# Patient Record
Sex: Female | Born: 1945
Health system: Southern US, Community
[De-identification: ages and names within clinical notes are randomized; demographics above are authoritative.]

## PROBLEM LIST (undated history)

## (undated) DIAGNOSIS — I10 Essential (primary) hypertension: Secondary | ICD-10-CM

## (undated) DIAGNOSIS — R413 Other amnesia: Secondary | ICD-10-CM

## (undated) DIAGNOSIS — M1990 Unspecified osteoarthritis, unspecified site: Secondary | ICD-10-CM

## (undated) DIAGNOSIS — E785 Hyperlipidemia, unspecified: Secondary | ICD-10-CM

## (undated) DIAGNOSIS — E119 Type 2 diabetes mellitus without complications: Secondary | ICD-10-CM

## (undated) HISTORY — DX: Hyperlipidemia, unspecified: E78.5

## (undated) HISTORY — DX: Essential (primary) hypertension: I10

## (undated) HISTORY — PX: EYE SURGERY: SHX253

## (undated) HISTORY — DX: Type 2 diabetes mellitus without complications: E11.9

---

## 1990-12-13 HISTORY — PX: ABDOMINAL HYSTERECTOMY: SUR658

## 1996-12-13 HISTORY — PX: OTHER SURGICAL HISTORY: SHX169

## 2012-04-17 LAB — HEMOGLOBIN A1C: Hgb A1c MFr Bld: 6.7 % — AB (ref 4.0–6.0)

## 2012-12-04 LAB — LIPID PANEL
Cholesterol: 211 mg/dL — AB (ref 0–200)
HDL: 52 mg/dL (ref 35–70)
HDL: 52 mg/dL (ref 35–70)
LDL Cholesterol: 131 mg/dL
LDL Cholesterol: 131 mg/dL
Triglycerides: 139 mg/dL (ref 40–160)

## 2012-12-04 LAB — BASIC METABOLIC PANEL
Creatinine: 0.8 mg/dL (ref 0.5–1.1)
Creatinine: 0.8 mg/dL (ref 0.5–1.1)
Potassium: 4.1 mmol/L (ref 3.4–5.3)

## 2013-01-03 ENCOUNTER — Encounter: Payer: Self-pay | Admitting: Family Medicine

## 2013-01-03 ENCOUNTER — Ambulatory Visit (INDEPENDENT_AMBULATORY_CARE_PROVIDER_SITE_OTHER): Payer: Medicare HMO | Admitting: Family Medicine

## 2013-01-03 VITALS — BP 116/69 | HR 72 | Ht 68.5 in | Wt 191.0 lb

## 2013-01-03 DIAGNOSIS — E785 Hyperlipidemia, unspecified: Secondary | ICD-10-CM

## 2013-01-03 DIAGNOSIS — I10 Essential (primary) hypertension: Secondary | ICD-10-CM

## 2013-01-03 DIAGNOSIS — E1122 Type 2 diabetes mellitus with diabetic chronic kidney disease: Secondary | ICD-10-CM | POA: Insufficient documentation

## 2013-01-03 DIAGNOSIS — M25561 Pain in right knee: Secondary | ICD-10-CM | POA: Insufficient documentation

## 2013-01-03 DIAGNOSIS — E11319 Type 2 diabetes mellitus with unspecified diabetic retinopathy without macular edema: Secondary | ICD-10-CM | POA: Insufficient documentation

## 2013-01-03 DIAGNOSIS — E119 Type 2 diabetes mellitus without complications: Secondary | ICD-10-CM

## 2013-01-03 NOTE — Progress Notes (Signed)
CC: Lisa Phelps is a 67 y.o. female is here for Establish Care   Subjective: HPI:  Very pleasant 67 year old here to establish care, former physician out of insurance network.  History of type 2  Diabetes: Has been taking metformin 500 mg twice a day. States over the last year or A1c has historically been around 6.0, never above 7.0. She denies polyuria polyphasia polydipsia. Denies diarrhea or GI disturbance. She denies motor or sensory disturbances poorly healing wounds. Denies foot lesions. She stated that join a gym but stays active with her grandchildren. Tries to watch what she eats with respect to carbohydrates. She says me she had a A1c done December, specific values unknown  History of essential hypertension: Currently taking losartan and hydrochlorothiazide. No outside blood pressures to report other than after visit summary from a December visit at her former physicians office showing normotensive blood pressure. Denies motor or sensory disturbances, chest pain, shortness of breath, headaches, orthopnea, nor peripheral edema she takes a baby aspirin a day  History of hyperlipidemia: She was once tried on Pravachol but had an intolerance, she can member specifically what happened. She's not on a statin and we are waiting outside records. She shows evidence of she had a lipid panel done in December she's uncertain of the specifics. She denies limb claudication or rest pain extremities.  Review of Systems - General ROS: negative for - chills, fever, night sweats, weight gain or weight loss Ophthalmic ROS: negative for - decreased vision Psychological ROS: negative for - anxiety or depression ENT ROS: negative for - hearing change, nasal congestion, tinnitus or allergies Hematological and Lymphatic ROS: negative for - bleeding problems, bruising or swollen lymph nodes Breast ROS: negative Respiratory ROS: no cough, shortness of breath, or wheezing Cardiovascular ROS: no chest pain or  dyspnea on exertion Gastrointestinal ROS: no abdominal pain, change in bowel habits, or black or bloody stools Genito-Urinary ROS: negative for - genital discharge, genital ulcers, incontinence or abnormal bleeding from genitals Musculoskeletal ROS: negative for - joint pain or muscle pain Neurological ROS: negative for - headaches or memory loss Dermatological ROS: negative for lumps, mole changes, rash and skin lesion changes  Past Medical History  Diagnosis Date  . Hypertension   . Diabetes   . Hyperlipidemia      Family History  Problem Relation Age of Onset  . Diabetes    . Hyperlipidemia    . Hypertension       History  Substance Use Topics  . Smoking status: Former Games developer  . Smokeless tobacco: Not on file  . Alcohol Use: Yes     Objective: Filed Vitals:   01/03/13 0903  BP: 116/69  Pulse: 72    General: Alert and Oriented, No Acute Distress HEENT: Pupils equal, round, reactive to light. Conjunctivae clear.  External ears unremarkable, canals clear with intact TMs with appropriate landmarks.  Middle ear appears open without effusion. Pink inferior turbinates.  Moist mucous membranes, pharynx without inflammation nor lesions.  Neck supple without palpable lymphadenopathy nor abnormal masses. Lungs: Clear to auscultation bilaterally, no wheezing/ronchi/rales.  Comfortable work of breathing. Good air movement. Cardiac: Regular rate and rhythm. Normal S1/S2.  No murmurs, rubs, nor gallops.   Abdomen:  obese soft nontender  Extremities: No peripheral edema.  Strong peripheral pulses.  Mental Status: No depression, anxiety, nor agitation. Skin: Warm and dry.  Assessment & Plan: Lisa Phelps was seen today for establish care.  Diagnoses and associated orders for this visit:  Essential hypertension  Type 2 diabetes mellitus  Hyperlipidemia ldl goal <100  Other Orders - aspirin 81 MG tablet; Take 81 mg by mouth daily. - hydrochlorothiazide (HYDRODIURIL) 12.5 MG tablet;  Take 12.5 mg by mouth daily. - losartan (COZAAR) 100 MG tablet; Take 100 mg by mouth daily. - metFORMIN (GLUCOPHAGE) 500 MG tablet; Take 500 mg by mouth 2 (two) times daily with a meal.    Essential hypertension: Controlled, agree with current medication regimen, discussed we can consider trial of hydrochlorothiazide cessation in the distant future if blood pressures remain well within normal range. Type 2 diabetes: Clinically stable, agree with her current metformin regimen. Sounds like she's met her goals less than 7.0 A1c wise, awaiting outside records, expect A1c in 2-3 months Hyperlipidemia: Clinically stable but I would like her to start a statin if possible. Will review outside records to determine what her intolerance was and if other statins were tried.  Return in about 3 months (around 04/03/2013).

## 2013-01-17 ENCOUNTER — Encounter: Payer: Self-pay | Admitting: Family Medicine

## 2013-01-17 ENCOUNTER — Telehealth: Payer: Self-pay | Admitting: Family Medicine

## 2013-01-17 NOTE — Telephone Encounter (Signed)
Pt notified,called and asked exactly where she had Bone Density done so I could call and get a copy. She states she had it done in [place] and she had a copy and would bring it by when she picked up records

## 2013-01-17 NOTE — Telephone Encounter (Signed)
Sue Lush, Will you please let Mrs. Paar know that I got her records from Dr. Manson Passey and per her request these records are at the front desk here for her.   (Also, she had a DEXA at [facility] in may of 2013 that was not included.  Can you please see if [facility] would be willing to forward [place] the results from this.  Thank you.)

## 2013-01-19 ENCOUNTER — Encounter: Payer: Self-pay | Admitting: *Deleted

## 2013-01-19 NOTE — Telephone Encounter (Signed)
Pt brought papers in but it was not what I needed, pt states she had the Dexa with [facility]. I will see if I can find out exactly where

## 2013-02-06 ENCOUNTER — Telehealth: Payer: Self-pay | Admitting: *Deleted

## 2013-02-06 NOTE — Telephone Encounter (Signed)
FYI: I'm not sure where this pt had her dexa. She said she had it done with [facility], but they do have record of her having one done. When I spoke with pt she wasn't sure exactly where she had it done, but she said she thought it was with [facility]

## 2013-03-06 ENCOUNTER — Ambulatory Visit (INDEPENDENT_AMBULATORY_CARE_PROVIDER_SITE_OTHER): Payer: Medicare HMO | Admitting: Family Medicine

## 2013-03-06 ENCOUNTER — Encounter: Payer: Self-pay | Admitting: Family Medicine

## 2013-03-06 VITALS — BP 127/83 | HR 91 | Wt 193.0 lb

## 2013-03-06 DIAGNOSIS — M1710 Unilateral primary osteoarthritis, unspecified knee: Secondary | ICD-10-CM

## 2013-03-06 DIAGNOSIS — M25561 Pain in right knee: Secondary | ICD-10-CM

## 2013-03-06 DIAGNOSIS — M1711 Unilateral primary osteoarthritis, right knee: Secondary | ICD-10-CM | POA: Insufficient documentation

## 2013-03-06 NOTE — Progress Notes (Signed)
CC: Lisa Phelps is a 67 y.o. female is here for right knee pain   Subjective: HPI:  Patient complains of right knee pain. It has been present for years. Over the last 4 months it seems to get worse on a monthly basis. At its best it is absent, at its worst it is moderate in severity.  It is worsened after long periods of inactivity or with long periods of high activity such as dancing. It seems to get worse when going downstairs or sitting for long periods of time. She has trouble localizing the pain, only described as within the knee. She describes swelling of the knee decades ago but nothing recently. Since I saw her last it has given way on her twice but does not catch or lock.  She has had steroid injections in the past which were beneficial.  She denies recent injury. She denies weakness or sensory disturbance in the right leg. She denies overlying skin changes, nor limb claudication    Review Of Systems Outlined In HPI  Past Medical History  Diagnosis Date  . Hypertension   . Diabetes   . Hyperlipidemia      Family History  Problem Relation Age of Onset  . Diabetes    . Hyperlipidemia    . Hypertension       History  Substance Use Topics  . Smoking status: Former Games developer  . Smokeless tobacco: Not on file  . Alcohol Use: Yes     Objective: Filed Vitals:   03/06/13 0956  BP: 127/83  Pulse: 91   Vital signs reviewed. General: Alert and Oriented, No Acute Distress Lungs: Clear and comfortable work of breathing, speaking in full sentences without accessory muscle use. Cardiac: Regular rate and rhythm.  Neuro: CN II-XII grossly intact, gait normal. Extremities: No peripheral edema.  Strong peripheral pulses.  RLE: No valgus or varus laxity or pain in the right knee. Moderate crepitus over the kneecap. No pain or apprehension with lateral deviation of the kneecap. No joint line pain. McMurray's negative, anterior drawer negative Mental Status: No depression, anxiety, nor  agitation. Logical though process. Skin: Warm and dry.  Assessment & Plan: Shazia was seen today for right knee pain.  Diagnoses and associated orders for this visit:  Right knee pain  Osteoarthritis of right knee    Discussed with patient treatment options including home or formal physical therapy and/or steroid injection. She has a nonsteroidal anti-inflammatory intolerance and would prefer an injection due to success in the past.   Return in about 3 months (around 06/06/2013).  Knee  Injection Procedure Note  Pre-operative Diagnosis: right osteoarthritis  Post-operative Diagnosis: same  Indications: Symptom relief from osteoarthritis  Anesthesia: topical cold spray  Procedure Details   Verbal consent was obtained for the procedure. The joint was prepped with Betadine and a small wheel of anesthetic was injected into the subcutaneous tissue. A 25 gauge needle was inserted into the medial inferior joint windo. 4 ml 1% lidocaine and 2 ml of triamcinolone (KENALOG) 40mg /ml was then injected into the joint through the same needle. The needle was removed and the area cleansed and dressed.  Complications:  None; patient tolerated the procedure well. Patient had immediate pain relief upon walking out to the exam room.

## 2013-03-12 ENCOUNTER — Encounter: Payer: Self-pay | Admitting: Family Medicine

## 2013-03-12 DIAGNOSIS — E1121 Type 2 diabetes mellitus with diabetic nephropathy: Secondary | ICD-10-CM | POA: Insufficient documentation

## 2013-03-13 ENCOUNTER — Telehealth: Payer: Self-pay | Admitting: Family Medicine

## 2013-03-13 NOTE — Telephone Encounter (Signed)
Patient walked in and advised that since she had her cortisone injec it has actually helped. Thanks

## 2013-03-16 ENCOUNTER — Encounter: Payer: Self-pay | Admitting: *Deleted

## 2013-04-03 ENCOUNTER — Ambulatory Visit (INDEPENDENT_AMBULATORY_CARE_PROVIDER_SITE_OTHER): Payer: Medicare HMO | Admitting: Family Medicine

## 2013-04-03 ENCOUNTER — Encounter: Payer: Self-pay | Admitting: Family Medicine

## 2013-04-03 VITALS — BP 119/76 | HR 81 | Wt 189.0 lb

## 2013-04-03 DIAGNOSIS — M1710 Unilateral primary osteoarthritis, unspecified knee: Secondary | ICD-10-CM

## 2013-04-03 DIAGNOSIS — I10 Essential (primary) hypertension: Secondary | ICD-10-CM

## 2013-04-03 DIAGNOSIS — E119 Type 2 diabetes mellitus without complications: Secondary | ICD-10-CM

## 2013-04-03 DIAGNOSIS — M1711 Unilateral primary osteoarthritis, right knee: Secondary | ICD-10-CM

## 2013-04-03 MED ORDER — METFORMIN HCL 500 MG PO TABS: 500.0000 mg | ORAL_TABLET | Freq: Two times a day (BID) | ORAL | Status: DC

## 2013-04-03 MED ORDER — AMBULATORY NON FORMULARY MEDICATION: Status: DC

## 2013-04-03 MED ORDER — LOSARTAN POTASSIUM 100 MG PO TABS: 100.0000 mg | ORAL_TABLET | Freq: Every day | ORAL | Status: DC

## 2013-04-03 MED ORDER — HYDROCHLOROTHIAZIDE 12.5 MG PO TABS: 12.5000 mg | ORAL_TABLET | Freq: Every day | ORAL | Status: DC

## 2013-04-03 NOTE — Progress Notes (Signed)
CC: Lisa Phelps is a 67 y.o. female is here for Diabetes   Subjective: HPI:  Followup type 2 diabetes: Fasting blood sugars have been consistently below 120. She denies hypoglycemic episodes. She's trying to stay active but no formal exercise program. She denies vision loss, motor sensory disturbances, polyuria polyphasia or polydipsia. Denies poorly healing wounds.  Continues on metformin twice a day. Continues on baby aspirin daily  Followup essential hypertension: Continues on hydrochlorothiazide and Cozaar daily basis. Denies chest pain, shortness of breath, orthopnea, peripheral edema, muscle cramps, nor muscle weakness.  Followup right knee pain: Last month patient received steroid injection into right knee. Since then she reports 99% improvement of her pain that has been absent since the day after the injection. She denies locking catching or giving way of the knee. She is able to do everything she wants in life without any pain interfering.      Review Of Systems Outlined In HPI  Past Medical History  Diagnosis Date  . Hypertension   . Diabetes   . Hyperlipidemia      Family History  Problem Relation Age of Onset  . Diabetes    . Hyperlipidemia    . Hypertension    . Diabetes Brother   . Hypertension Brother      History  Substance Use Topics  . Smoking status: Former Games developer  . Smokeless tobacco: Not on file  . Alcohol Use: Yes     Objective: Filed Vitals:   04/03/13 0924  BP: 119/76  Pulse: 81    Vital signs reviewed. General: Alert and Oriented, No Acute Distress HEENT: Pupils equal, round, reactive to light. Conjunctivae clear.  External ears unremarkable.  Moist mucous membranes. Lungs: Clear and comfortable work of breathing, speaking in full sentences without accessory muscle use. No wheezing rhonchi nor rales Cardiac: Regular rate and rhythm. No murmurs rubs or gallops Neuro: CN II-XII grossly intact, gait normal. Extremities: No peripheral edema.   Strong peripheral pulses.  Right knee: Full range of motion and strength with mild crepitus.. Diabetic Foot Exam: Dorsalis pedis pulses 1+ bilaterally.  Monofilament sensation intact on plantar and dorsal surface bilaterally.  No signs of infection, skin breakdown, nor ulceration. Mental Status: No depression, anxiety, nor agitation. Logical though process. Skin: Warm and dry.  Assessment & Plan: Inola was seen today for diabetes.  Diagnoses and associated orders for this visit:  Type 2 diabetes mellitus - POCT HgB A1C - metFORMIN (GLUCOPHAGE) 500 MG tablet; Take 1 tablet (500 mg total) by mouth 2 (two) times daily with a meal. - AMBULATORY NON FORMULARY MEDICATION; Bayer Contour TS Blood glucose testing strips, use one daily to test blood sugar.  Dx Type 2 diabetes 250.00  Essential hypertension - hydrochlorothiazide (HYDRODIURIL) 12.5 MG tablet; Take 1 tablet (12.5 mg total) by mouth daily. - losartan (COZAAR) 100 MG tablet; Take 1 tablet (100 mg total) by mouth daily.  Osteoarthritis of right knee    Type 2 diabetes: A1c 6.2, controlled and improving. Continue on metformin and baby aspirin. Essential hypertension: Controlled, continue hydrochlorothiazide and losartan Right knee pain and osteoarthritis: Improved and stable, encouraged her to stay active and to return as needed  For further evaluation if pain should return.  Patient would like to space out visits of possible, encouraged her return before 6 months if blood pressures above 140/90 or blood sugars above 120 fasting.   Return in about 6 months (around 10/03/2013).

## 2013-06-21 ENCOUNTER — Other Ambulatory Visit: Payer: Self-pay

## 2013-10-03 ENCOUNTER — Encounter: Payer: Self-pay | Admitting: Family Medicine

## 2013-10-03 ENCOUNTER — Ambulatory Visit (INDEPENDENT_AMBULATORY_CARE_PROVIDER_SITE_OTHER): Payer: Medicare HMO | Admitting: Family Medicine

## 2013-10-03 VITALS — BP 128/83 | HR 73 | Wt 192.0 lb

## 2013-10-03 DIAGNOSIS — Z86010 Personal history of colonic polyps: Secondary | ICD-10-CM

## 2013-10-03 DIAGNOSIS — E1129 Type 2 diabetes mellitus with other diabetic kidney complication: Secondary | ICD-10-CM

## 2013-10-03 DIAGNOSIS — I10 Essential (primary) hypertension: Secondary | ICD-10-CM

## 2013-10-03 DIAGNOSIS — E119 Type 2 diabetes mellitus without complications: Secondary | ICD-10-CM

## 2013-10-03 DIAGNOSIS — E785 Hyperlipidemia, unspecified: Secondary | ICD-10-CM

## 2013-10-03 DIAGNOSIS — E1121 Type 2 diabetes mellitus with diabetic nephropathy: Secondary | ICD-10-CM

## 2013-10-03 DIAGNOSIS — Z23 Encounter for immunization: Secondary | ICD-10-CM

## 2013-10-03 DIAGNOSIS — Z1239 Encounter for other screening for malignant neoplasm of breast: Secondary | ICD-10-CM

## 2013-10-03 DIAGNOSIS — N058 Unspecified nephritic syndrome with other morphologic changes: Secondary | ICD-10-CM

## 2013-10-03 LAB — BASIC METABOLIC PANEL WITH GFR
CO2: 30 mEq/L (ref 19–32)
Calcium: 9.9 mg/dL (ref 8.4–10.5)
Chloride: 102 mEq/L (ref 96–112)
Creat: 0.87 mg/dL (ref 0.50–1.10)
Glucose, Bld: 115 mg/dL — ABNORMAL HIGH (ref 70–99)
Sodium: 138 mEq/L (ref 135–145)

## 2013-10-03 LAB — LIPID PANEL
Cholesterol: 203 mg/dL — ABNORMAL HIGH (ref 0–200)
LDL Cholesterol: 139 mg/dL — ABNORMAL HIGH (ref 0–99)
Total CHOL/HDL Ratio: 4.5 Ratio
Triglycerides: 96 mg/dL (ref <150)
VLDL: 19 mg/dL (ref 0–40)

## 2013-10-03 LAB — POCT UA - MICROALBUMIN

## 2013-10-03 MED ORDER — LOSARTAN POTASSIUM 100 MG PO TABS: 100.0000 mg | ORAL_TABLET | Freq: Every day | ORAL | Status: DC

## 2013-10-03 MED ORDER — HYDROCHLOROTHIAZIDE 12.5 MG PO TABS: 12.5000 mg | ORAL_TABLET | Freq: Every day | ORAL | Status: DC

## 2013-10-03 MED ORDER — METFORMIN HCL 500 MG PO TABS: 500.0000 mg | ORAL_TABLET | Freq: Two times a day (BID) | ORAL | Status: DC

## 2013-10-03 NOTE — Progress Notes (Signed)
CC: Lisa Phelps is a 67 y.o. female is here for Diabetes and Hypertension   Subjective: HPI:  Patient returns in good spirits  Followup type 2 diabetes: She is checking her blood sugar about once a week. It has always been below 120 she denies hypoglycemic episodes. She sticks to a low carb diet and exercise taking care of her 15 grandchildren. Denies polyuria polyphagia polydipsia nor vision loss. Denies motor or sensory disturbances.  She takes a baby aspirin daily  Essential hypertension: Continues on hydrochlorothiazide and losartan. No outside blood pressures to report. Denies chest pain shortness of breath nor lightheadedness. No headache nor peripheral edema  Hyperlipidemia: Is been about a year since her last cluster of panel was checked she is fasting today. She's not on a statin.  Review Of Systems Outlined In HPI  Past Medical History  Diagnosis Date  . Hypertension   . Diabetes   . Hyperlipidemia      Family History  Problem Relation Age of Onset  . Diabetes    . Hyperlipidemia    . Hypertension    . Diabetes Brother   . Hypertension Brother      History  Substance Use Topics  . Smoking status: Former Games developer  . Smokeless tobacco: Not on file  . Alcohol Use: Yes     Objective: Filed Vitals:   10/03/13 0936  BP: 128/83  Pulse: 73    General: Alert and Oriented, No Acute Distress HEENT: Pupils equal, round, reactive to light. Conjunctivae clear.  Moist mucous membranes Lungs: Clear to auscultation bilaterally, no wheezing/ronchi/rales.  Comfortable work of breathing. Good air movement. Cardiac: Regular rate and rhythm. Normal S1/S2.  No murmurs, rubs, nor gallops.   Abdomen: Soft nontender Feet: Dorsalis pedis pulses 1+ bilaterally.  Monofilament sensation intact on plantar and dorsal surface bilaterally.  Vibratory sensation preserved at the medial 1st MTP joint bilaterally.  No signs of infection, skin breakdown, nor ulceration. Extremities: No  peripheral edema.  Strong peripheral pulses.  Mental Status: No depression, anxiety, nor agitation. Skin: Warm and dry.  Assessment & Plan: Lisa Phelps was seen today for diabetes and hypertension.  Diagnoses and associated orders for this visit:  Type 2 diabetes mellitus - POCT HgB A1C - POCT UA - Microalbumin - BASIC METABOLIC PANEL WITH GFR - metFORMIN (GLUCOPHAGE) 500 MG tablet; Take 1 tablet (500 mg total) by mouth 2 (two) times daily with a meal.  Essential hypertension - BASIC METABOLIC PANEL WITH GFR - hydrochlorothiazide (HYDRODIURIL) 12.5 MG tablet; Take 1 tablet (12.5 mg total) by mouth daily. - losartan (COZAAR) 100 MG tablet; Take 1 tablet (100 mg total) by mouth daily.  Hyperlipidemia LDL goal <100 - Lipid Profile  Microalbuminuric diabetic nephropathy  Screening for breast cancer - MM Digital Screening; Future  Personal history of colonic polyps - Ambulatory referral to Gastroenterology  Need for prophylactic vaccination and inoculation against influenza    Type 2 diabetes: Controlled with A1c 6.4, she is over due for ophthalmology exam, she will continue baby aspirin, she will consider statin if LDL above 100. Continue metformin Microalbuminuric diabetic neuropathy: Worsening, checking renal function today She is on losartan to continue this as creatinine albumin ratio now above 300, focus on strict blood pressure and glucose control Essential hypertension: Controlled continue hydrochlorothiazide and losartan. She is overdue for mammogram and followup of polyps on remote colonoscopy referrals have been placed  25 minutes spent face-to-face during visit today of which at least 50% was counseling or coordinating care regarding  essential hypertension, diabetic nephropathy, type 2 diabetes, colon cancer and breast cancer screening.   Return in about 6 months (around 04/03/2014).

## 2013-10-04 ENCOUNTER — Telehealth: Payer: Self-pay | Admitting: Family Medicine

## 2013-10-04 NOTE — Telephone Encounter (Signed)
Lisa Phelps, Will you please let Lisa Phelps that the American Diabetic Association guidelines would recommend that she start on a cholesterol lowering medication.  I see that pravastatin/pravachol is listed in her allergy list, I want to recommend a similar medication in that class however I'd like to know what her allergy/intolerance was prior to prescribing something.  Can she let us know how she responded to pravastatin/pravachol?

## 2013-10-04 NOTE — Telephone Encounter (Signed)
Pt notified. She had joint pain with the previous medication

## 2013-10-05 MED ORDER — ROSUVASTATIN CALCIUM 5 MG PO TABS: 5.0000 mg | ORAL_TABLET | Freq: Every day | ORAL | Status: DC

## 2013-10-05 NOTE — Telephone Encounter (Signed)
Pt notified via vm

## 2013-10-05 NOTE — Telephone Encounter (Signed)
Lisa Phelps, Will you please let Lisa Phelps know that I've sent a different cholesterol medication to her [facility], this medication is less likely to cause joint and muscle discomfort.

## 2013-10-16 ENCOUNTER — Ambulatory Visit (INDEPENDENT_AMBULATORY_CARE_PROVIDER_SITE_OTHER): Payer: Medicare HMO

## 2013-10-16 DIAGNOSIS — Z1231 Encounter for screening mammogram for malignant neoplasm of breast: Secondary | ICD-10-CM

## 2013-10-16 DIAGNOSIS — Z1239 Encounter for other screening for malignant neoplasm of breast: Secondary | ICD-10-CM

## 2013-11-28 ENCOUNTER — Ambulatory Visit (INDEPENDENT_AMBULATORY_CARE_PROVIDER_SITE_OTHER): Payer: Medicare HMO | Admitting: Family Medicine

## 2013-11-28 ENCOUNTER — Encounter: Payer: Self-pay | Admitting: Family Medicine

## 2013-11-28 VITALS — BP 152/87 | HR 89 | Wt 194.0 lb

## 2013-11-28 DIAGNOSIS — M79671 Pain in right foot: Secondary | ICD-10-CM

## 2013-11-28 DIAGNOSIS — M79609 Pain in unspecified limb: Secondary | ICD-10-CM

## 2013-11-28 DIAGNOSIS — R05 Cough: Secondary | ICD-10-CM

## 2013-11-28 MED ORDER — MELOXICAM 15 MG PO TABS: 15.0000 mg | ORAL_TABLET | Freq: Every day | ORAL | Status: DC | PRN

## 2013-11-28 NOTE — Progress Notes (Signed)
CC: Lisa Phelps is a 67 y.o. female is here for Foot Pain and Cough   Subjective: HPI:  Complains of right foot pain that has been present since Monday described only as pain mild to moderate in severity improving since onset without any particular intervention. Localized to the medial aspect of the right foot which is nonradiating. Associated with redness and mild swelling on the medial aspect of the foot she's never had this pain or appearance before. Symptoms came on after she was enjoying herself chasing her nephew on a hardwood floor on Sunday otherwise no changes to daily activities. Denies fevers, chills, ankle instability. She has been taking ibuprofen which does improve the pain however only for a few hours. She has a history of gastritis which is why she takes nonsteroidal anti-inflammatories sparingly. She denies inability to bear weight at initial onset of pain nor today  Reports that she's had a cough ever since starting Crestor is a dry cough and will be present for 30 minutes after she takes her Crestor she gets this complaint at no other time of day. Denies shortness of breath, wheezing, nor chest discomfort.     Review Of Systems Outlined In HPI  Past Medical History  Diagnosis Date  . Hypertension   . Diabetes   . Hyperlipidemia      Family History  Problem Relation Age of Onset  . Diabetes    . Hyperlipidemia    . Hypertension    . Diabetes Brother   . Hypertension Brother      History  Substance Use Topics  . Smoking status: Former Games developer  . Smokeless tobacco: Not on file  . Alcohol Use: Yes     Objective: Filed Vitals:   11/28/13 1015  BP: 152/87  Pulse: 89    General: Alert and Oriented, No Acute Distress HEENT: Pupils equal, round, reactive to light. Conjunctivae clear.  Moist mucous membranes pharynx unremarkable Lungs: Clear to auscultation bilaterally, no wheezing/ronchi/rales.  Comfortable work of breathing. Good air movement. Cardiac: Regular  rate and rhythm. Normal S1/S2.  No murmurs, rubs, nor gallops.   Extremities: No peripheral edema.  Strong peripheral pulses. Right foot exam: No malleoli discomfort to palpation Bilaterally, no pain at base of fifth metatarsal, no pain in the toe box, pain reproduced with palpation of medial navicular, negative anterior drawer, pain is not reproduced with resisted plantar flexion/dorsiflexion/eversion, pain is reproduced with resisted inversion Mental Status: No depression, anxiety, nor agitation. Skin: Warm and dry.  Assessment & Plan: Lisa Phelps was seen today for foot pain and cough.  Diagnoses and associated orders for this visit:  Right foot pain - meloxicam (MOBIC) 15 MG tablet; Take 1 tablet (15 mg total) by mouth daily as needed for pain (right foot pain).  Cough     Foot pain: Suspect pain is from posterior tibial tendinitis at the insertion to the navicular.  Discussed adding over-the-counter orthotics to help raise arch also given handout on home exercises and stretches to do for the next 2-3 weeks on a daily basis. Occasional use of Mobic to control pain. If not improving after 2-3 weeks would recommend seeing Dr. Karie Schwalbe. in sports medicine for consideration of custom orthotics Cough: Stop Crestor for one week, if cough is absent during this week we'll switch to different statin like Lipitor  Return if symptoms worsen or fail to improve.

## 2013-12-04 ENCOUNTER — Telehealth: Payer: Self-pay | Admitting: *Deleted

## 2013-12-04 DIAGNOSIS — E785 Hyperlipidemia, unspecified: Secondary | ICD-10-CM

## 2013-12-04 MED ORDER — ATORVASTATIN CALCIUM 10 MG PO TABS: 10.0000 mg | ORAL_TABLET | Freq: Every day | ORAL | Status: DC

## 2013-12-04 NOTE — Telephone Encounter (Signed)
Pt states that her cough is about 25% better off the Crestor, but still present and wanted to know what you need her to do.  If change the med please send to [facility]

## 2013-12-04 NOTE — Telephone Encounter (Signed)
Stop crestor, switch to atorvastatin which i've sent to [facility].

## 2014-04-03 ENCOUNTER — Encounter: Payer: Self-pay | Admitting: Family Medicine

## 2014-04-03 ENCOUNTER — Ambulatory Visit (INDEPENDENT_AMBULATORY_CARE_PROVIDER_SITE_OTHER): Payer: Medicare HMO | Admitting: Family Medicine

## 2014-04-03 VITALS — BP 123/81 | HR 68 | Wt 208.0 lb

## 2014-04-03 DIAGNOSIS — I10 Essential (primary) hypertension: Secondary | ICD-10-CM

## 2014-04-03 DIAGNOSIS — Z5181 Encounter for therapeutic drug level monitoring: Secondary | ICD-10-CM

## 2014-04-03 DIAGNOSIS — Z79899 Other long term (current) drug therapy: Secondary | ICD-10-CM

## 2014-04-03 DIAGNOSIS — E785 Hyperlipidemia, unspecified: Secondary | ICD-10-CM

## 2014-04-03 DIAGNOSIS — E119 Type 2 diabetes mellitus without complications: Secondary | ICD-10-CM

## 2014-04-03 LAB — POCT GLYCOSYLATED HEMOGLOBIN (HGB A1C): Hemoglobin A1C: 7

## 2014-04-03 MED ORDER — HYDROCHLOROTHIAZIDE 12.5 MG PO TABS: 12.5000 mg | ORAL_TABLET | Freq: Every day | ORAL | Status: DC

## 2014-04-03 MED ORDER — METFORMIN HCL 500 MG PO TABS: 500.0000 mg | ORAL_TABLET | Freq: Two times a day (BID) | ORAL | Status: DC

## 2014-04-03 MED ORDER — LOSARTAN POTASSIUM 100 MG PO TABS: 100.0000 mg | ORAL_TABLET | Freq: Every day | ORAL | Status: DC

## 2014-04-03 MED ORDER — ATORVASTATIN CALCIUM 10 MG PO TABS: 10.0000 mg | ORAL_TABLET | Freq: Every day | ORAL | Status: DC

## 2014-04-03 NOTE — Progress Notes (Signed)
CC: Lisa Phelps is a 68 y.o. female is here for Diabetes   Subjective: HPI:  Followup type 2 diabetes: Continues to take metformin twice a day without side effects. No outside blood sugars to report. Denies polyuria polyphasia polydipsia. She admits that carbohydrate intake has been increasing since I saw her last. Denies vision loss. Has not had ophthalmology visit in over a year.  Followup essential hypertension: Continues on hydrochlorothiazide and losartan without known side effects. No outside blood pressures to report. Denies chest pain, shortness of breath, orthopnea, peripheral edema, nor motor or sensory disturbances  Followup hyperlipidemia: Ever since stopping Crestor she no longer has a cough. In hindsight she was getting myalgias on Crestor however no episodes of this while on atorvastatin. Denies myalgias or right upper quadrant pain currently.  Review Of Systems Outlined In HPI  Past Medical History  Diagnosis Date  . Hypertension   . Diabetes   . Hyperlipidemia     Past Surgical History  Procedure Laterality Date  . Abdominal hysterectomy  1992  . Gallbladder removed  1998   Family History  Problem Relation Age of Onset  . Diabetes    . Hyperlipidemia    . Hypertension    . Diabetes Brother   . Hypertension Brother     History   Social History  . Marital Status: Married    Spouse Name: N/A    Number of Children: N/A  . Years of Education: N/A   Occupational History  . Not on file.   Social History Main Topics  . Smoking status: Former Games developermoker  . Smokeless tobacco: Not on file  . Alcohol Use: Yes  . Drug Use: No  . Sexual Activity: No   Other Topics Concern  . Not on file   Social History Narrative  . No narrative on file     Objective: BP 123/81  Pulse 68  Wt 208 lb (94.348 kg)  General: Alert and Oriented, No Acute Distress HEENT: Pupils equal, round, reactive to light. Conjunctivae clear.  Moist mucous membranes pharynx  unremarkable Lungs: Clear to auscultation bilaterally, no wheezing/ronchi/rales.  Comfortable work of breathing. Good air movement. Cardiac: Regular rate and rhythm. Normal S1/S2.  No murmurs, rubs, nor gallops.   Diabetic Foot Exam: Dorsalis pedis pulses 1+ bilaterally.  Monofilament sensation intact on plantar and dorsal surface bilaterally.  No signs of infection, skin breakdown, nor ulceration. Extremities: No peripheral edema.  Strong peripheral pulses.  Mental Status: No depression, anxiety, nor agitation. Skin: Warm and dry.  Assessment & Plan: [name] was seen today for diabetes.  Diagnoses and associated orders for this visit:  Type 2 diabetes mellitus - POCT HgB A1C - metFORMIN (GLUCOPHAGE) 500 MG tablet; Take 1 tablet (500 mg total) by mouth 2 (two) times daily with a meal.  Essential hypertension - hydrochlorothiazide (HYDRODIURIL) 12.5 MG tablet; Take 1 tablet (12.5 mg total) by mouth daily.  Hyperlipidemia LDL goal <100 - Lipid panel - atorvastatin (LIPITOR) 10 MG tablet; Take 1 tablet (10 mg total) by mouth daily.  Encounter for monitoring statin therapy - COMPLETE METABOLIC PANEL WITH GFR  Other Orders - losartan (COZAAR) 100 MG tablet; Take 1 tablet (100 mg total) by mouth daily.    Type 2 diabetes: A1c 7.0 control continue metformin regimen. I encouraged her to get a ophthalmology exam in the near future. Essential hypertension: Controlled continue hydrochlorothiazide and losartan Hyperlipidemia: Clinically controlled however due for first lipid panel after starting a statin. Checking liver enzymes  I  reminded her to set up for colonoscopy as soon as possible as she is quite overdue. Orders have already been placed, she is awaiting getting her finances in line.  Return in about 3 months (around 07/03/2014).

## 2014-05-13 HISTORY — PX: COLONOSCOPY: SHX174

## 2014-05-14 ENCOUNTER — Encounter: Payer: Self-pay | Admitting: Family Medicine

## 2014-05-14 DIAGNOSIS — Z860100 Personal history of colon polyps, unspecified: Secondary | ICD-10-CM | POA: Insufficient documentation

## 2014-07-26 ENCOUNTER — Telehealth: Payer: Self-pay | Admitting: Family Medicine

## 2014-07-26 DIAGNOSIS — E785 Hyperlipidemia, unspecified: Secondary | ICD-10-CM

## 2014-07-26 DIAGNOSIS — E119 Type 2 diabetes mellitus without complications: Secondary | ICD-10-CM

## 2014-07-26 DIAGNOSIS — I10 Essential (primary) hypertension: Secondary | ICD-10-CM

## 2014-07-26 NOTE — Telephone Encounter (Signed)
Refill req 

## 2014-08-22 ENCOUNTER — Encounter: Payer: Self-pay | Admitting: Family Medicine

## 2014-08-22 DIAGNOSIS — H269 Unspecified cataract: Secondary | ICD-10-CM | POA: Insufficient documentation

## 2014-09-25 ENCOUNTER — Other Ambulatory Visit: Payer: Self-pay | Admitting: *Deleted

## 2014-09-25 DIAGNOSIS — E119 Type 2 diabetes mellitus without complications: Secondary | ICD-10-CM

## 2014-09-25 MED ORDER — METFORMIN HCL 500 MG PO TABS: 500.0000 mg | ORAL_TABLET | Freq: Two times a day (BID) | ORAL | Status: DC

## 2014-10-03 ENCOUNTER — Encounter: Payer: Self-pay | Admitting: Family Medicine

## 2014-10-03 ENCOUNTER — Ambulatory Visit (INDEPENDENT_AMBULATORY_CARE_PROVIDER_SITE_OTHER): Payer: Medicare HMO | Admitting: Family Medicine

## 2014-10-03 ENCOUNTER — Other Ambulatory Visit: Payer: Self-pay | Admitting: Family Medicine

## 2014-10-03 VITALS — BP 136/86 | HR 79 | Wt 202.0 lb

## 2014-10-03 DIAGNOSIS — I10 Essential (primary) hypertension: Secondary | ICD-10-CM

## 2014-10-03 DIAGNOSIS — E119 Type 2 diabetes mellitus without complications: Secondary | ICD-10-CM

## 2014-10-03 DIAGNOSIS — Z1231 Encounter for screening mammogram for malignant neoplasm of breast: Secondary | ICD-10-CM

## 2014-10-03 DIAGNOSIS — Z23 Encounter for immunization: Secondary | ICD-10-CM

## 2014-10-03 DIAGNOSIS — E1121 Type 2 diabetes mellitus with diabetic nephropathy: Secondary | ICD-10-CM

## 2014-10-03 DIAGNOSIS — E785 Hyperlipidemia, unspecified: Secondary | ICD-10-CM

## 2014-10-03 LAB — LIPID PANEL
Cholesterol: 135 mg/dL (ref 0–200)
HDL: 42 mg/dL (ref >39)
LDL CALC: 74 mg/dL (ref 0–99)
Total CHOL/HDL Ratio: 3.2 Ratio
Triglycerides: 96 mg/dL (ref <150)
VLDL: 19 mg/dL (ref 0–40)

## 2014-10-03 NOTE — Progress Notes (Signed)
CC: Lisa EhrichSarah Phelps is a [age] y.o. female is here for Diabetes   Subjective: HPI:  Followup type 2 diabetes: He continues to take metformin on a daily basis without known side effects nor outside blood pressures to report. Reports that diabetic retinopathy was not worsened at her appointment in September. Denies polyuria polyphagia polydipsia. She's never lose about 3-4 pounds since I saw her last with trying to cut back on calories. Continues to take losartan for both hypertension and microalbuminuric diabetic nephropathy.  Essential hypertension: Continues to take losartan hydrochlorothiazide on a daily basis without outside blood pressures to report denies chest pain shortness of breath orthopnea nor peripheral edema  Followup hyperlipidemia: She states that since stopping Crestor her cough did not improve. She reports it only present in the evening when lying down it has not improved with over-the-counter cough medications or allergy medication. Cough does not interfere with quality of life and has not been accompanied by wheezing or shortness of breath. In hindsight she believes she's had this ever since she was a child. He needs to take Lipitor on a daily basis without rub or quadrant pain or myalgias     Review Of Systems Outlined In HPI  Past Medical History  Diagnosis Date  . Hypertension   . Diabetes   . Hyperlipidemia     Past Surgical History  Procedure Laterality Date  . Abdominal hysterectomy  1992  . Gallbladder removed  1998   Family History  Problem Relation Age of Onset  . Diabetes    . Hyperlipidemia    . Hypertension    . Diabetes Brother   . Hypertension Brother     History   Social History  . Marital Status: Married    Spouse Name: N/A    Number of Children: N/A  . Years of Education: N/A   Occupational History  . Not on file.   Social History Main Topics  . Smoking status: Former Games developermoker  . Smokeless tobacco: Not on file  . Alcohol Use: Yes  . Drug  Use: No  . Sexual Activity: No   Other Topics Concern  . Not on file   Social History Narrative  . No narrative on file     Objective: BP 136/86  Pulse 79  Wt 202 lb (91.627 kg)  General: Alert and Oriented, No Acute Distress HEENT: Pupils equal, round, reactive to light. Conjunctivae clear.  External ears unremarkable, canals clear with intact TMs with appropriate landmarks.  Middle ear appears open without effusion. Pink inferior turbinates.  Moist mucous membranes, pharynx without inflammation nor lesions.  Neck supple without palpable lymphadenopathy nor abnormal masses. Lungs: Clear to auscultation bilaterally, no wheezing/ronchi/rales.  Comfortable work of breathing. Good air movement. Cardiac: Regular rate and rhythm. Normal S1/S2.  No murmurs, rubs, nor gallops.   Extremities: No peripheral edema.  Strong peripheral pulses.  Mental Status: No depression, anxiety, nor agitation. Skin: Warm and dry.  Assessment & Plan: Lisa SagoSarah was seen today for diabetes.  Diagnoses and associated orders for this visit:  Hyperlipidemia LDL goal <100 - Lipid panel  Microalbuminuric diabetic nephropathy - Microalbumin / creatinine urine ratio  Type 2 diabetes mellitus with diabetic nephropathy - POCT HgB A1C - Microalbumin / creatinine urine ratio  Essential hypertension  Type 2 diabetes mellitus without complication    Hyperlipidemia: Continue Lipitor pending lipid panel today Microalbuminuric diabetic nephropathy: Continue losartan pending microalbumin creatinine ratio today Essential hypertension: Controlled continue current antihypertensives Type 2 diabetes: A1c 7.0 control continue  metformin. She tells me she does not need refills at this time   Return in about 6 months (around 04/04/2015) for Diabetes BP Follow Up.

## 2014-10-04 LAB — MICROALBUMIN / CREATININE URINE RATIO
Creatinine, Urine: 219.6 mg/dL
Microalb Creat Ratio: 162.1 mg/g — ABNORMAL HIGH (ref 0.0–30.0)
Microalb, Ur: 35.6 mg/dL — ABNORMAL HIGH

## 2014-10-17 ENCOUNTER — Ambulatory Visit (INDEPENDENT_AMBULATORY_CARE_PROVIDER_SITE_OTHER): Payer: Medicare HMO

## 2014-10-17 DIAGNOSIS — Z1231 Encounter for screening mammogram for malignant neoplasm of breast: Secondary | ICD-10-CM

## 2014-11-06 ENCOUNTER — Other Ambulatory Visit: Payer: Self-pay | Admitting: *Deleted

## 2014-11-06 DIAGNOSIS — E785 Hyperlipidemia, unspecified: Secondary | ICD-10-CM

## 2014-11-06 MED ORDER — LOSARTAN POTASSIUM 100 MG PO TABS: 100.0000 mg | ORAL_TABLET | Freq: Every day | ORAL | Status: DC

## 2014-11-06 MED ORDER — ATORVASTATIN CALCIUM 10 MG PO TABS: 10.0000 mg | ORAL_TABLET | Freq: Every day | ORAL | Status: DC

## 2014-11-12 ENCOUNTER — Ambulatory Visit (INDEPENDENT_AMBULATORY_CARE_PROVIDER_SITE_OTHER): Payer: Medicare HMO | Admitting: Family Medicine

## 2014-11-12 ENCOUNTER — Encounter: Payer: Self-pay | Admitting: Family Medicine

## 2014-11-12 DIAGNOSIS — M1711 Unilateral primary osteoarthritis, right knee: Secondary | ICD-10-CM

## 2014-11-12 DIAGNOSIS — M25561 Pain in right knee: Secondary | ICD-10-CM

## 2014-11-12 MED ORDER — MELOXICAM 15 MG PO TABS: 15.0000 mg | ORAL_TABLET | Freq: Every day | ORAL | Status: DC | PRN

## 2014-11-12 NOTE — Progress Notes (Signed)
CC: Lisa EhrichSarah Phelps is a 68 y.o. female is here for right knee pain   Subjective: HPI:  Complains of right knee pain that has been present for the past week that has been persistent and worse with standing for long periods of time. It is also worse after periods of inactivity. She describes it as deep in the knee with no radiation of the pain. Moderate in severity described only as pain. Feels identical to pain that she's experienced in the past which has been relieved with nonsteroidal anti-inflammatories or cortisone injections. She's had no benefit from oral nonsteroidal anti-inflammatories over the past week. She denies any swelling redness or warmth of the joint over the past week. Denies any trauma but has been walking further and longer than she is used to. Symptoms are improved only with using a cane. She denies catching locking giving way. Denies fevers chills or joint pain elsewhere   Review Of Systems Outlined In HPI  Past Medical History  Diagnosis Date  . Hypertension   . Diabetes   . Hyperlipidemia     Past Surgical History  Procedure Laterality Date  . Abdominal hysterectomy  1992  . Gallbladder removed  1998   Family History  Problem Relation Age of Onset  . Diabetes    . Hyperlipidemia    . Hypertension    . Diabetes Brother   . Hypertension Brother     History   Social History  . Marital Status: Married    Spouse Name: N/A    Number of Children: N/A  . Years of Education: N/A   Occupational History  . Not on file.   Social History Main Topics  . Smoking status: Former Games developermoker  . Smokeless tobacco: Not on file  . Alcohol Use: Yes  . Drug Use: No  . Sexual Activity: No   Other Topics Concern  . Not on file   Social History Narrative     Objective: BP 141/96 mmHg  Pulse 109  Wt 203 lb (92.08 kg)  General: Alert and Oriented, No Acute Distress HEENT: Pupils equal, round, reactive to light. Conjunctivae clear.  Moist mucous membranes Cardiac:  Regular rate and rhythm. Right knee exam shows full-strength and range of motion. There is no swelling, redness, nor warmth overlying the knee.  No patellar crepitus. No patellar apprehension. No pain with palpation of the inferior patellar pole.  No pain or laxity with valgus nor varus stress. Anterior drawer is negative.No popliteal space tenderness or palpable mass. No  lateral joint line tenderness to palpation however mild tenderness to palpation of the lateral joint line. Extremities: No peripheral edema.  Strong peripheral pulses.  Mental Status: No depression, anxiety, nor agitation. Skin: Warm and dry.  Assessment & Plan: Lisa Phelps was seen today for right knee pain.  Diagnoses and associated orders for this visit:  Right knee pain - meloxicam (MOBIC) 15 MG tablet; Take 1 tablet (15 mg total) by mouth daily as needed for pain (right foot pain).  Primary osteoarthritis of right knee  Other Orders - Cancel: metFORMIN (GLUCOPHAGE) 500 MG tablet; Take 1 tablet (500 mg total) by mouth 2 (two) times daily with a meal. - Cancel: losartan (COZAAR) 100 MG tablet; Take 1 tablet (100 mg total) by mouth daily. - Cancel: hydrochlorothiazide (HYDRODIURIL) 12.5 MG tablet; Take 1 tablet (12.5 mg total) by mouth daily. - Cancel: atorvastatin (LIPITOR) 10 MG tablet; Take 1 tablet (10 mg total) by mouth daily.    Right knee pain due to  osteoarthritis flare. She would like a cortisone injection which is understandable today. I provided her with this along with as needed meloxicam to take at home. Signs and symptoms requring emergent/urgent reevaluation were discussed with the patient.   Return if symptoms worsen or fail to improve.  Knee Injection Procedure Note  Pre-operative Diagnosis: right OA  Post-operative Diagnosis: same  Indications: pain  Anesthesia:topical cold spray  Procedure Details   Verbal consent was obtained for the procedure. The joint was prepped with alcohol. . A 22  gauge needle was inserted into the inferior aspect of the joint from a medial approach.2 ml 1% lidocaine and 2 ml of triamcinolone (KENALOG) 40mg /ml was then injected into the joint through the same needle. The needle was removed and the area cleansed and dressed.  Complications:  None; patient tolerated the procedure well.

## 2014-11-27 ENCOUNTER — Other Ambulatory Visit: Payer: Self-pay | Admitting: Family Medicine

## 2015-01-15 ENCOUNTER — Other Ambulatory Visit: Payer: Self-pay | Admitting: Family Medicine

## 2015-01-16 NOTE — Telephone Encounter (Signed)
appt due around 04/04/2015

## 2015-02-18 ENCOUNTER — Other Ambulatory Visit: Payer: Self-pay | Admitting: Family Medicine

## 2015-03-13 ENCOUNTER — Other Ambulatory Visit: Payer: Self-pay | Admitting: Family Medicine

## 2015-03-18 ENCOUNTER — Other Ambulatory Visit: Payer: Self-pay | Admitting: Family Medicine

## 2015-03-25 ENCOUNTER — Other Ambulatory Visit: Payer: Self-pay | Admitting: Family Medicine

## 2015-04-09 ENCOUNTER — Ambulatory Visit (INDEPENDENT_AMBULATORY_CARE_PROVIDER_SITE_OTHER): Payer: Medicare HMO | Admitting: Family Medicine

## 2015-04-09 ENCOUNTER — Encounter: Payer: Self-pay | Admitting: Family Medicine

## 2015-04-09 VITALS — BP 126/88 | HR 95 | Temp 98.2°F | Resp 18 | Wt 202.0 lb

## 2015-04-09 DIAGNOSIS — E119 Type 2 diabetes mellitus without complications: Secondary | ICD-10-CM

## 2015-04-09 DIAGNOSIS — M1711 Unilateral primary osteoarthritis, right knee: Secondary | ICD-10-CM

## 2015-04-09 DIAGNOSIS — I10 Essential (primary) hypertension: Secondary | ICD-10-CM

## 2015-04-09 LAB — POCT GLYCOSYLATED HEMOGLOBIN (HGB A1C): Hemoglobin A1C: 7.4

## 2015-04-09 MED ORDER — METFORMIN HCL 500 MG PO TABS: ORAL_TABLET | ORAL | Status: DC

## 2015-04-09 NOTE — Progress Notes (Signed)
CC: Lisa Phelps is a 69 y.o. female is here for Diabetes   Subjective: HPI:  Follow-up type 2 diabetes: Continues to take metformin 500 mg twice a day. Denies hypoglycemia since I saw her last. Denies polyuria polyphagia or polydipsia. Denies poorly healing wound or vision loss. No outside blood sugars to report.  Follow-up essential hypertension: Continues on losartan on a daily basis with no outside blood pressures report. No chest pain shortness of breath orthopnea peripheral edema nor motor or sensory disturbances.  Complains of returned right knee pain that is moderate in severity that is worse after periods of inactivity and first thing in the morning. It's moderate in severity and nonradiating localized deep in the knee. It was 100% alleviated after steroid injection a little over 4 months ago and pain-free until 1 week ago. Slightly loose with meloxicam however causes are intolerable epigastric pain. She wants know she have another injection. No mechanical symptoms, locking, catching, giving way. She is uncertain about swelling but no warmth or any other skin changes   Review Of Systems Outlined In HPI  Past Medical History  Diagnosis Date  . Hypertension   . Diabetes   . Hyperlipidemia     Past Surgical History  Procedure Laterality Date  . Abdominal hysterectomy  1992  . Gallbladder removed  1998   Family History  Problem Relation Age of Onset  . Diabetes    . Hyperlipidemia    . Hypertension    . Diabetes Brother   . Hypertension Brother     History   Social History  . Marital Status: Married    Spouse Name: N/A  . Number of Children: N/A  . Years of Education: N/A   Occupational History  . Not on file.   Social History Main Topics  . Smoking status: Former Games developer  . Smokeless tobacco: Not on file  . Alcohol Use: Yes  . Drug Use: No  . Sexual Activity: No   Other Topics Concern  . Not on file   Social History Narrative     Objective: BP 126/88  mmHg  Pulse 95  Temp(Src) 98.2 F (36.8 C) (Oral)  Resp 18  Wt 202 lb (91.627 kg)  SpO2 98%  General: Alert and Oriented, No Acute Distress HEENT: Pupils equal, round, reactive to light. Conjunctivae clear. Moist mucous membranes Lungs: Clear to auscultation bilaterally, no wheezing/ronchi/rales.  Comfortable work of breathing. Good air movement. Cardiac: Regular rate and rhythm. Normal S1/S2.  No murmurs, rubs, nor gallops.   Abdomen: Normal bowel sounds, soft and non tender without palpable masses. Extremities: Trace effusion on the right knee without redness warmth or bruising. Full range of motion and strength. No joint line tenderness medially or laterally, no popliteal tenderness Mental Status: No depression, anxiety, nor agitation. Skin: Warm and dry.  Assessment & Plan: Lisa Phelps was seen today for diabetes.  Diagnoses and all orders for this visit:  Type 2 diabetes mellitus without complication Orders: -     POCT HgB A1C  Primary osteoarthritis of right knee  Essential hypertension  Other orders -     metFORMIN (GLUCOPHAGE) 500 MG tablet; One by mouth every morning and two at dinner time.   Type 2 diabetes: Uncontrolled chronic condition increasing metformin, 500 mg in the morning 1 g at night. Essential hypertension: Controlled continue losartan Osteoarthritis of the right knee: Steroid injection today discussed she can continue to get these every 3 months if it continues to help with pain.  Knee Injection  Procedure Note  Pre-operative Diagnosis: right OA  Post-operative Diagnosis: same  Indications: Symptom relief from osteoarthritis  Anesthesia: Topical [place]  Procedure Details   Verbal consent was obtained for the procedure. The joint was prepped with alcohol and topical cold spray was applied to the skin. A 22 gauge needle was inserted into the superior aspect of the joint from a lateral approach. 2 ml 1% lidocaine and 2 ml of triamcinolone (KENALOG)  40mg /ml was then injected into the joint through the same needle. The needle was removed and the area cleansed and dressed.  Complications:  None; patient tolerated the procedure well.  Return in about 3 months (around 07/09/2015), or if symptoms worsen or fail to improve.

## 2015-05-27 ENCOUNTER — Other Ambulatory Visit: Payer: Self-pay | Admitting: Family Medicine

## 2015-07-07 ENCOUNTER — Ambulatory Visit (INDEPENDENT_AMBULATORY_CARE_PROVIDER_SITE_OTHER): Payer: Medicare HMO | Admitting: Family Medicine

## 2015-07-07 ENCOUNTER — Encounter: Payer: Self-pay | Admitting: Family Medicine

## 2015-07-07 VITALS — BP 135/77 | HR 80 | Wt 196.0 lb

## 2015-07-07 DIAGNOSIS — E1121 Type 2 diabetes mellitus with diabetic nephropathy: Secondary | ICD-10-CM

## 2015-07-07 DIAGNOSIS — I10 Essential (primary) hypertension: Secondary | ICD-10-CM | POA: Diagnosis not present

## 2015-07-07 LAB — POCT GLYCOSYLATED HEMOGLOBIN (HGB A1C): HEMOGLOBIN A1C: 6.7

## 2015-07-07 NOTE — Progress Notes (Signed)
CC: Lisa Phelps is a 69 y.o. female is here for Diabetes   Subjective: HPI:  Follow-up type 2 diabetes: Since I saw her lastshe increased her evening dose of metformin. There's been no abdominal pain or GI complaints. Specifically no diarrhea or loose stools. No outside blood sugars to report. She tries to stay active with her grandkids and has been eating at home more often than when I saw her last, she's cut back drastically on fast food. No polyuria or polyphagia or polydipsia  Follow-up essential hypertension: Continues to take losartan on a daily basis along with hydrochlorothiazide. No outside blood pressures to report. Denies chest pain shortness of breath orthopnea nor peripheral edema   Review Of Systems Outlined In HPI  Past Medical History  Diagnosis Date  . Hypertension   . Diabetes   . Hyperlipidemia     Past Surgical History  Procedure Laterality Date  . Abdominal hysterectomy  1992  . Gallbladder removed  1998   Family History  Problem Relation Age of Onset  . Diabetes    . Hyperlipidemia    . Hypertension    . Diabetes Brother   . Hypertension Brother     History   Social History  . Marital Status: Married    Spouse Name: N/A  . Number of Children: N/A  . Years of Education: N/A   Occupational History  . Not on file.   Social History Main Topics  . Smoking status: Former Games developer  . Smokeless tobacco: Not on file  . Alcohol Use: Yes  . Drug Use: No  . Sexual Activity: No   Other Topics Concern  . Not on file   Social History Narrative     Objective: BP 135/77 mmHg  Pulse 80  Wt 196 lb (88.905 kg)  General: Alert and Oriented, No Acute Distress HEENT: Pupils equal, round, reactive to light. Conjunctivae clear.  Moist mucous membranes Lungs: Clear to auscultation bilaterally, no wheezing/ronchi/rales.  Comfortable work of breathing. Good air movement. Cardiac: Regular rate and rhythm. Normal S1/S2.  No murmurs, rubs, nor gallops.    Extremities: No peripheral edema.  Strong peripheral pulses.  Mental Status: No depression, anxiety, nor agitation. Skin: Warm and dry.  Assessment & Plan: Lisa Phelps was seen today for diabetes.  Diagnoses and all orders for this visit:  Type 2 diabetes mellitus with diabetic nephropathy Orders: -     POCT HgB A1C  Essential hypertension   Type 2 diabetes: A1c is at goal today, continue metformin twice a day Essential hypertension: Controlled continue losartan and hydrochlorothiazide   Return in about 3 months (around 10/07/2015).

## 2015-09-01 ENCOUNTER — Other Ambulatory Visit: Payer: Self-pay | Admitting: Family Medicine

## 2015-10-07 ENCOUNTER — Ambulatory Visit (INDEPENDENT_AMBULATORY_CARE_PROVIDER_SITE_OTHER): Payer: Medicare HMO | Admitting: Family Medicine

## 2015-10-07 ENCOUNTER — Encounter: Payer: Self-pay | Admitting: Family Medicine

## 2015-10-07 VITALS — BP 121/83 | HR 68 | Temp 97.7°F | Wt 189.0 lb

## 2015-10-07 DIAGNOSIS — E119 Type 2 diabetes mellitus without complications: Secondary | ICD-10-CM | POA: Diagnosis not present

## 2015-10-07 DIAGNOSIS — Z23 Encounter for immunization: Secondary | ICD-10-CM

## 2015-10-07 DIAGNOSIS — E1121 Type 2 diabetes mellitus with diabetic nephropathy: Secondary | ICD-10-CM

## 2015-10-07 DIAGNOSIS — I10 Essential (primary) hypertension: Secondary | ICD-10-CM

## 2015-10-07 LAB — POCT GLYCOSYLATED HEMOGLOBIN (HGB A1C): HEMOGLOBIN A1C: 6.6

## 2015-10-07 MED ORDER — ZOSTER VACCINE LIVE 19400 UNT/0.65ML ~~LOC~~ SOLR: 0.6500 mL | Freq: Once | SUBCUTANEOUS | Status: DC

## 2015-10-07 MED ORDER — METFORMIN HCL 500 MG PO TABS: ORAL_TABLET | ORAL | Status: DC

## 2015-10-07 MED ORDER — ATORVASTATIN CALCIUM 10 MG PO TABS: 10.0000 mg | ORAL_TABLET | Freq: Every day | ORAL | Status: DC

## 2015-10-07 MED ORDER — HYDROCHLOROTHIAZIDE 12.5 MG PO TABS: 12.5000 mg | ORAL_TABLET | Freq: Every day | ORAL | Status: DC

## 2015-10-07 MED ORDER — LOSARTAN POTASSIUM 100 MG PO TABS: 100.0000 mg | ORAL_TABLET | Freq: Every day | ORAL | Status: DC

## 2015-10-07 NOTE — Progress Notes (Signed)
CC: Lisa EhrichSarah Phelps is a [age] y.o. female is here for Diabetes   Subjective: HPI:  Follow-up type 2 diabetes: Continues on metformin daily. No outside blood sugars report. Denies polyuria or polyphagia polydipsia. She's cut back on portions in her diet and has lost approximate 6 pound since I saw her last. She tells me she is feeling great.  Follow-up essential hypertension: Continues on losartan and hydrochlorothiazide. No outside blood pressures report. Denies chest pain shortness of breath orthopnea nor peripheral edema  Hyperlipidemia: Continues to take atorvastatin on a daily basis without right upper quadrant pain nor myalgia.   Review Of Systems Outlined In HPI  Past Medical History  Diagnosis Date  . Hypertension   . Diabetes (HCC)   . Hyperlipidemia     Past Surgical History  Procedure Laterality Date  . Abdominal hysterectomy  1992  . Gallbladder removed  1998   Family History  Problem Relation Age of Onset  . Diabetes    . Hyperlipidemia    . Hypertension    . Diabetes Brother   . Hypertension Brother     Social History   Social History  . Marital Status: Married    Spouse Name: N/A  . Number of Children: N/A  . Years of Education: N/A   Occupational History  . Not on file.   Social History Main Topics  . Smoking status: Former Games developermoker  . Smokeless tobacco: Not on file  . Alcohol Use: Yes  . Drug Use: No  . Sexual Activity: No   Other Topics Concern  . Not on file   Social History Narrative     Objective: BP 121/83 mmHg  Pulse 68  Temp(Src) 97.7 F (36.5 C) (Oral)  Wt 189 lb (85.73 kg)  General: Alert and Oriented, No Acute Distress HEENT: Pupils equal, round, reactive to light. Conjunctivae clear.  Moist mucous membranes Lungs: Clear to auscultation bilaterally, no wheezing/ronchi/rales.  Comfortable work of breathing. Good air movement. Cardiac: Regular rate and rhythm. Normal S1/S2.  No murmurs, rubs, nor gallops.   Extremities: No  peripheral edema.  Strong peripheral pulses.  Mental Status: No depression, anxiety, nor agitation. Skin: Warm and dry.  Assessment & Plan: Lisa Phelps was seen today for diabetes.  Diagnoses and all orders for this visit:  Needs flu shot -     Flu Vaccine QUAD 36+ mos PF IM (Fluarix & Fluzone Quad PF)  Controlled type 2 diabetes mellitus without complication, unspecified long term insulin use status (HCC) -     POCT HgB A1C  Microalbuminuric diabetic nephropathy (HCC)  Type 2 diabetes mellitus with diabetic nephropathy, without long-term current use of insulin (HCC)  Essential hypertension  Other orders -     atorvastatin (LIPITOR) 10 MG tablet; Take 1 tablet (10 mg total) by mouth daily. -     hydrochlorothiazide (HYDRODIURIL) 12.5 MG tablet; Take 1 tablet (12.5 mg total) by mouth daily. -     losartan (COZAAR) 100 MG tablet; Take 1 tablet (100 mg total) by mouth daily. -     metFORMIN (GLUCOPHAGE) 500 MG tablet; One by mouth every morning and two at dinner time. -     zoster vaccine live, PF, (ZOSTAVAX) [id number] UNT/0.65ML injection; Inject 19,400 Units into the skin once.   Type 2 diabetes: A1c of 6.6, controlled, continue metformin daily. Essential hypertension: Controlled continue hydrochlorothiazide and losartan. Diabetic nephropathy: Continue protection with losartan Hyperlipidemia: Quickly controlled continue atorvastatin daily.  She was given a prescription for Zostavax that she can  use a local pharmacy to determine if this is affordable. I've advised her to strongly consider getting this immunization.  Return in about 3 months (around 01/07/2016) for Blood Sugar.

## 2015-10-10 ENCOUNTER — Other Ambulatory Visit: Payer: Self-pay | Admitting: Family Medicine

## 2015-10-10 DIAGNOSIS — Z139 Encounter for screening, unspecified: Secondary | ICD-10-CM

## 2015-10-23 ENCOUNTER — Ambulatory Visit (INDEPENDENT_AMBULATORY_CARE_PROVIDER_SITE_OTHER): Payer: Medicare HMO

## 2015-10-23 DIAGNOSIS — Z1231 Encounter for screening mammogram for malignant neoplasm of breast: Secondary | ICD-10-CM

## 2015-10-23 DIAGNOSIS — Z139 Encounter for screening, unspecified: Secondary | ICD-10-CM

## 2016-01-07 ENCOUNTER — Encounter: Payer: Self-pay | Admitting: Family Medicine

## 2016-01-07 ENCOUNTER — Ambulatory Visit (INDEPENDENT_AMBULATORY_CARE_PROVIDER_SITE_OTHER): Payer: Medicare HMO | Admitting: Family Medicine

## 2016-01-07 VITALS — BP 123/77 | HR 77 | Wt 191.0 lb

## 2016-01-07 DIAGNOSIS — E1121 Type 2 diabetes mellitus with diabetic nephropathy: Secondary | ICD-10-CM

## 2016-01-07 DIAGNOSIS — I10 Essential (primary) hypertension: Secondary | ICD-10-CM | POA: Diagnosis not present

## 2016-01-07 LAB — POCT GLYCOSYLATED HEMOGLOBIN (HGB A1C): HEMOGLOBIN A1C: 7.2

## 2016-01-07 NOTE — Progress Notes (Signed)
CC: Lisa Phelps is a 70 y.o. female is here for Hyperglycemia   Subjective: HPI:   follow-up essential hypertension: Continues on losartan and hydrochlorothiazide. No outside blood pressures to report. Denies chest pain shortness of breath orthopnea nor peripheral edema.   Follow-up type 2 diabetes: she started getting upset stomach when she was taking 2 metformin at night so cut back to only 1 in the morning and 1 at night. No outside blood sugars to report. She tells me that she's been less strict about carbohydrate resections. She denies polyuria polyphagia polydipsia or vision loss.   Review Of Systems Outlined In HPI  Past Medical History  Diagnosis Date  . Hypertension   . Diabetes (HCC)   . Hyperlipidemia     Past Surgical History  Procedure Laterality Date  . Abdominal hysterectomy  1992  . Gallbladder removed  1998   Family History  Problem Relation Age of Onset  . Diabetes    . Hyperlipidemia    . Hypertension    . Diabetes Brother   . Hypertension Brother     Social History   Social History  . Marital Status: Married    Spouse Name: N/A  . Number of Children: N/A  . Years of Education: N/A   Occupational History  . Not on file.   Social History Main Topics  . Smoking status: Former Games developer  . Smokeless tobacco: Not on file  . Alcohol Use: Yes  . Drug Use: No  . Sexual Activity: No   Other Topics Concern  . Not on file   Social History Narrative     Objective: BP 123/77 mmHg  Pulse 77  Wt 191 lb (86.637 kg)  Vital signs reviewed. General: Alert and Oriented, No Acute Distress HEENT: Pupils equal, round, reactive to light. Conjunctivae clear.  External ears unremarkable.  Moist mucous membranes. Lungs: Clear and comfortable work of breathing, speaking in full sentences without accessory muscle use. Cardiac: Regular rate and rhythm.  Neuro: CN II-XII grossly intact, gait normal. Extremities: No peripheral edema.  Strong peripheral pulses.   Mental Status: No depression, anxiety, nor agitation. Logical though process. Skin: Warm and dry.  Assessment & Plan: Hailey was seen today for hyperglycemia.  Diagnoses and all orders for this visit:  Type 2 diabetes mellitus with diabetic nephropathy, without long-term current use of insulin (HCC)  Essential hypertension  type 2 diabetes: A1c of 7.2,chronic uncontrolled condition, continue morning dose of metformin however at night increased to 1-1/2 tablets if tolerated from a GI standpoint. Essential hypertension: Controlled continue losartan and hydrochlorothiazide   Return in about 3 months (around 04/06/2016).

## 2016-01-07 NOTE — Addendum Note (Signed)
Addended by: Thom Chimes on: 01/07/2016 10:03 AM   Modules accepted: Orders

## 2016-01-09 ENCOUNTER — Other Ambulatory Visit: Payer: Self-pay | Admitting: Family Medicine

## 2016-01-09 DIAGNOSIS — Z0000 Encounter for general adult medical examination without abnormal findings: Secondary | ICD-10-CM

## 2016-02-13 ENCOUNTER — Other Ambulatory Visit: Payer: Self-pay | Admitting: Family Medicine

## 2016-02-13 DIAGNOSIS — Z0000 Encounter for general adult medical examination without abnormal findings: Secondary | ICD-10-CM

## 2016-02-13 MED ORDER — LOSARTAN POTASSIUM 100 MG PO TABS: 100.0000 mg | ORAL_TABLET | Freq: Every day | ORAL | Status: DC

## 2016-02-13 MED ORDER — HYDROCHLOROTHIAZIDE 12.5 MG PO TABS: 12.5000 mg | ORAL_TABLET | Freq: Every day | ORAL | Status: DC

## 2016-02-13 MED ORDER — ATORVASTATIN CALCIUM 10 MG PO TABS: 10.0000 mg | ORAL_TABLET | Freq: Every day | ORAL | Status: DC

## 2016-02-16 LAB — HM DIABETES EYE EXAM

## 2016-02-18 ENCOUNTER — Encounter: Payer: Self-pay | Admitting: Family Medicine

## 2016-04-06 ENCOUNTER — Encounter: Payer: Self-pay | Admitting: Family Medicine

## 2016-04-06 ENCOUNTER — Ambulatory Visit (INDEPENDENT_AMBULATORY_CARE_PROVIDER_SITE_OTHER): Payer: Medicare HMO | Admitting: Family Medicine

## 2016-04-06 VITALS — BP 136/87 | HR 72 | Wt 197.0 lb

## 2016-04-06 DIAGNOSIS — E785 Hyperlipidemia, unspecified: Secondary | ICD-10-CM | POA: Diagnosis not present

## 2016-04-06 DIAGNOSIS — E11319 Type 2 diabetes mellitus with unspecified diabetic retinopathy without macular edema: Secondary | ICD-10-CM | POA: Diagnosis not present

## 2016-04-06 DIAGNOSIS — E1121 Type 2 diabetes mellitus with diabetic nephropathy: Secondary | ICD-10-CM | POA: Diagnosis not present

## 2016-04-06 DIAGNOSIS — I10 Essential (primary) hypertension: Secondary | ICD-10-CM | POA: Diagnosis not present

## 2016-04-06 LAB — POCT UA - MICROALBUMIN
Albumin/Creatinine Ratio, Urine, POC: >300
CREATININE, POC: 100 mg/dL
MICROALBUMIN (UR) POC: 150 mg/L

## 2016-04-06 LAB — POCT GLYCOSYLATED HEMOGLOBIN (HGB A1C): Hemoglobin A1C: 6.7

## 2016-04-06 MED ORDER — AMBULATORY NON FORMULARY MEDICATION: Status: DC

## 2016-04-06 MED ORDER — METFORMIN HCL 500 MG PO TABS: ORAL_TABLET | ORAL | Status: DC

## 2016-04-06 NOTE — Progress Notes (Signed)
CC: Lisa Phelps is a 70 y.o. female is here for Hyperglycemia and Medication Refill   Subjective: HPI:  Follow-up type 2 diabetes: She is taking metformin once in the morning and once in the evening.  She denies any abdominal discomfort or diarrhea nor any bowel irregularities. Her blood sugars are ranging between 83-118. She's been much more active since I saw her last gardening, working outside, and playing with her children. She denies any polyuria plication or polydipsia. She had an eye exam done since I saw her last which was unremarkable.  Follow-up hypertension: She is taking losartan and hydrochlorothiazide with 100% compliance. She denies any chest pain shortness of breath orthopnea nor peripheral edema. She denies any motor or sensory disturbances.  Review Of Systems Outlined In HPI  Past Medical History  Diagnosis Date  . Hypertension   . Diabetes (HCC)   . Hyperlipidemia     Past Surgical History  Procedure Laterality Date  . Abdominal hysterectomy  1992  . Gallbladder removed  1998   Family History  Problem Relation Age of Onset  . Diabetes    . Hyperlipidemia    . Hypertension    . Diabetes Brother   . Hypertension Brother     Social History   Social History  . Marital Status: Married    Spouse Name: N/A  . Number of Children: N/A  . Years of Education: N/A   Occupational History  . Not on file.   Social History Main Topics  . Smoking status: Former Games developer  . Smokeless tobacco: Not on file  . Alcohol Use: Yes  . Drug Use: No  . Sexual Activity: No   Other Topics Concern  . Not on file   Social History Narrative     Objective: BP 136/87 mmHg  Pulse 72  Wt 197 lb (89.359 kg)  Vital signs reviewed. General: Alert and Oriented, No Acute Distress HEENT: Pupils equal, round, reactive to light. Conjunctivae clear.  External ears unremarkable.  Moist mucous membranes. Lungs: Clear and comfortable work of breathing, speaking in full sentences  without accessory muscle use. Cardiac: Regular rate and rhythm.  Neuro: CN II-XII grossly intact, gait normal. Extremities: No peripheral edema.  Strong peripheral pulses.  Mental Status: No depression, anxiety, nor agitation. Logical though process. Skin: Warm and dry.  Assessment & Plan: Lisa Phelps was seen today for hyperglycemia and medication refill.  Diagnoses and all orders for this visit:  Type 2 diabetes mellitus with diabetic nephropathy, without long-term current use of insulin (HCC) -     POCT UA - Microalbumin -     AMBULATORY NON FORMULARY MEDICATION; Bayer Contour TS Blood glucose testing strips, use twice a day to test blood sugar.  Dx Type 2 diabetes 250.00 -     AMBULATORY NON FORMULARY MEDICATION; Bayer Contour TS Blood glucose lancets, use twice daily to test blood sugar.  Dx Type 2 diabetes 250.00 -     POCT HgB A1C  Diabetic retinopathy associated with type 2 diabetes mellitus, macular edema presence unspecified, unspecified retinopathy severity (HCC)  Microalbuminuric diabetic nephropathy (HCC)  Essential hypertension  Other orders -     metFORMIN (GLUCOPHAGE) 500 MG tablet; One by mouth every morning and one at dinner time.   Type 2 diabetes: A1c of 6.7 today, controlled with metformin twice a day. Encouraged her to continue to stay active it seems like it's making a huge difference. Essential hypertension: Controlled with losartan and hydrochlorothiazide. No need to change today. Microalbumin diabetic  nephropathy: Microalbumin to creatinine ratio above 300, she'll need to remain on losartan. Hyperlipidemia: She is tolerating atorvastatin no need to change cholesterol regimen.  Return in about 3 months (around 07/06/2016) for sugar and blood pressure.

## 2016-05-24 ENCOUNTER — Other Ambulatory Visit: Payer: Self-pay | Admitting: Family Medicine

## 2016-07-06 ENCOUNTER — Ambulatory Visit: Payer: Medicare HMO | Admitting: Family Medicine

## 2016-07-06 ENCOUNTER — Ambulatory Visit (INDEPENDENT_AMBULATORY_CARE_PROVIDER_SITE_OTHER): Payer: Medicare HMO | Admitting: Family Medicine

## 2016-07-06 VITALS — BP 146/85 | HR 77

## 2016-07-06 DIAGNOSIS — M25562 Pain in left knee: Secondary | ICD-10-CM | POA: Diagnosis not present

## 2016-07-06 NOTE — Progress Notes (Signed)
Lisa Phelps is a 70 y.o. female who presents to [facility] today for left knee pain.  Patient has a history of bilateral knee DJD. However she notes worsening medial left knee pain over the last 6 days. She denies any injury but does note swelling and popping. She's been using ibuprofen and ice which help. No radiating pain weakness or numbness fevers or chills.   Past Medical History:  Diagnosis Date  . Diabetes (HCC)   . Hyperlipidemia   . Hypertension    Past Surgical History:  Procedure Laterality Date  . ABDOMINAL HYSTERECTOMY  1992  . gallbladder removed  1998   Social History  Substance Use Topics  . Smoking status: Former Games developer  . Smokeless tobacco: Not on file  . Alcohol use Yes   family history includes Diabetes in her brother; Hypertension in her brother.  ROS:  No headache, visual changes, nausea, vomiting, diarrhea, constipation, dizziness, abdominal pain, skin rash, fevers, chills, night sweats, weight loss, swollen lymph nodes, body aches, joint swelling, muscle aches, chest pain, shortness of breath, mood changes, visual or auditory hallucinations.    Medications: Current Outpatient Prescriptions  Medication Sig Dispense Refill  . AMBULATORY NON FORMULARY MEDICATION Bayer Contour TS Blood glucose testing strips, use twice a day to test blood sugar.  Dx Type 2 diabetes 250.00 50 strip 11  . AMBULATORY NON FORMULARY MEDICATION Bayer Contour TS Blood glucose lancets, use twice daily to test blood sugar.  Dx Type 2 diabetes 250.00 50 Units 11  . aspirin 81 MG tablet Take 81 mg by mouth daily.    Marland Kitchen atorvastatin (LIPITOR) 10 MG tablet TAKE 1 TABLET EVERY DAY 90 tablet 0  . hydrochlorothiazide (HYDRODIURIL) 12.5 MG tablet Take 1 tablet (12.5 mg total) by mouth daily. 90 tablet 0  . losartan (COZAAR) 100 MG tablet Take 1 tablet (100 mg total) by mouth daily. 90 tablet 0  . metFORMIN (GLUCOPHAGE) 500 MG tablet One by mouth  every morning and one at dinner time. 180 tablet 2   No current facility-administered medications for this visit.    Allergies  Allergen Reactions  . Crestor [Rosuvastatin]     cough  . Nsaids   . Pravastatin     myalgias     Exam:  BP (!) 146/85   Pulse 77   General: Well Developed, well nourished, and in no acute distress.  Neuro/Psych: Alert and oriented x3, extra-ocular muscles intact, able to move all 4 extremities, sensation grossly intact. Skin: Warm and dry, no rashes noted.  Respiratory: Not using accessory muscles, speaking in full sentences, trachea midline.  Cardiovascular: Pulses palpable, no extremity edema. Abdomen: Does not appear distended. MSK: Left knee: Mild effusion. Otherwise normal-appearing Normal range of motion. Nontender. Stable ligamentous exam.  Procedure: Real-time Ultrasound Guided Injection of left knee  Device: GE Logiq E  Images permanently stored and available for review in the ultrasound unit. Verbal informed consent obtained. Discussed risks and benefits of procedure. Warned about infection bleeding damage to structures skin hypopigmentation and fat atrophy among others. Patient expresses understanding and agreement Time-out conducted.  Noted no overlying erythema, induration, or other signs of local infection.  Skin prepped in a sterile fashion.  Local anesthesia: Topical Ethyl chloride.  With sterile technique and under real time ultrasound guidance: 80 mg of Kenalog and 4 mL of Marcaine injected easily.  Lot number: Marcaine [id number] Kenalog [id number] Completed without difficulty  Pain immediately resolved suggesting accurate placement of  the medication.  Advised to call if fevers/chills, erythema, induration, drainage, or persistent bleeding.  Images permanently stored and available for review in the ultrasound unit.  Impression: Technically successful ultrasound guided injection.     No results found for this or any  previous visit (from the past 24 hour(s)). No results found.   Assessment and Plan: 70 y.o. female with left knee pain likely due to DJD. Status post injection today. Continued NSAIDs rest ice elevation. Return as needed.   Discussed warning signs or symptoms. Please see discharge instructions. Patient expresses understanding.

## 2016-07-06 NOTE — Patient Instructions (Signed)
Thank you for coming in today.   Call or go to the ER if you develop a large red swollen joint with extreme pain or oozing puss.    Return as needed.  

## 2016-07-14 ENCOUNTER — Ambulatory Visit: Payer: Medicare HMO | Admitting: Osteopathic Medicine

## 2016-07-16 ENCOUNTER — Encounter: Payer: Self-pay | Admitting: Family Medicine

## 2016-07-16 ENCOUNTER — Ambulatory Visit (INDEPENDENT_AMBULATORY_CARE_PROVIDER_SITE_OTHER): Payer: Medicare HMO | Admitting: Family Medicine

## 2016-07-16 VITALS — BP 134/85 | HR 76 | Wt 199.0 lb

## 2016-07-16 DIAGNOSIS — M949 Disorder of cartilage, unspecified: Secondary | ICD-10-CM

## 2016-07-16 DIAGNOSIS — E11319 Type 2 diabetes mellitus with unspecified diabetic retinopathy without macular edema: Secondary | ICD-10-CM

## 2016-07-16 DIAGNOSIS — E1121 Type 2 diabetes mellitus with diabetic nephropathy: Secondary | ICD-10-CM | POA: Diagnosis not present

## 2016-07-16 DIAGNOSIS — Z23 Encounter for immunization: Secondary | ICD-10-CM | POA: Diagnosis not present

## 2016-07-16 DIAGNOSIS — I10 Essential (primary) hypertension: Secondary | ICD-10-CM | POA: Diagnosis not present

## 2016-07-16 DIAGNOSIS — E785 Hyperlipidemia, unspecified: Secondary | ICD-10-CM

## 2016-07-16 DIAGNOSIS — E119 Type 2 diabetes mellitus without complications: Secondary | ICD-10-CM

## 2016-07-16 DIAGNOSIS — Z1159 Encounter for screening for other viral diseases: Secondary | ICD-10-CM

## 2016-07-16 DIAGNOSIS — Z780 Asymptomatic menopausal state: Secondary | ICD-10-CM

## 2016-07-16 DIAGNOSIS — M899 Disorder of bone, unspecified: Secondary | ICD-10-CM

## 2016-07-16 LAB — LIPID PANEL
CHOLESTEROL: 167 mg/dL (ref 125–200)
HDL: 55 mg/dL (ref >=46)
LDL CALC: 96 mg/dL (ref <130)
Total CHOL/HDL Ratio: 3 Ratio (ref <=5.0)
Triglycerides: 80 mg/dL (ref <150)
VLDL: 16 mg/dL (ref <30)

## 2016-07-16 LAB — COMPREHENSIVE METABOLIC PANEL
ALT: 17 U/L (ref 6–29)
AST: 21 U/L (ref 10–35)
Albumin: 4.1 g/dL (ref 3.6–5.1)
Alkaline Phosphatase: 76 U/L (ref 33–130)
BILIRUBIN TOTAL: 0.3 mg/dL (ref 0.2–1.2)
BUN: 13 mg/dL (ref 7–25)
CHLORIDE: 99 mmol/L (ref 98–110)
CO2: 29 mmol/L (ref 20–31)
CREATININE: 0.99 mg/dL (ref 0.50–0.99)
Calcium: 9.7 mg/dL (ref 8.6–10.4)
Glucose, Bld: 144 mg/dL — ABNORMAL HIGH (ref 65–99)
Potassium: 4.2 mmol/L (ref 3.5–5.3)
SODIUM: 136 mmol/L (ref 135–146)
Total Protein: 7.2 g/dL (ref 6.1–8.1)

## 2016-07-16 LAB — CBC
HEMATOCRIT: 44 % (ref 35.0–45.0)
Hemoglobin: 14.2 g/dL (ref 11.7–15.5)
MCH: 30.5 pg (ref 27.0–33.0)
MCHC: 32.3 g/dL (ref 32.0–36.0)
MCV: 94.4 fL (ref 80.0–100.0)
MPV: 9.2 fL (ref 7.5–12.5)
PLATELETS: 309 10*3/uL (ref 140–400)
RBC: 4.66 MIL/uL (ref 3.80–5.10)
RDW: 14 % (ref 11.0–15.0)
WBC: 7.9 10*3/uL (ref 3.8–10.8)

## 2016-07-16 LAB — POCT GLYCOSYLATED HEMOGLOBIN (HGB A1C): HEMOGLOBIN A1C: 7

## 2016-07-16 NOTE — Patient Instructions (Signed)
Thank you for coming in today. Get fasting labs soon.  Get bone density test soon.  Continue medicines.  Return in 3 months for diabetes recheck.   Call or go to the emergency room if you get worse, have trouble breathing, have chest pains, or palpitations.

## 2016-07-16 NOTE — Progress Notes (Signed)
Lisa Phelps is a 70 y.o. female who presents to [facility] Phelps: Primary Care Sports Medicine today for establish care and discuss the following 1) diabetes: Doing well. No polyuria or polydipsia. She feels her feet well and inspects them frequently. She gets regular eye exam and her ophthalmologist. She feels great.  2) hypertension: Also doing well with the below medications. No chest pains palpitations or shortness of breath.  3) dyslipidemia: Doing well with Lipitor. No muscle pains.  4) knee pain: Improved following injection a few weeks ago.  5) health maintenance: Patient is due for Tdap, hepatitis C screening, diabetic foot exam, A1c, and bone density testing. Other health maintenance items are up-to-date.    Past Medical History:  Diagnosis Date  . Diabetes (HCC)   . Hyperlipidemia   . Hypertension    Past Surgical History:  Procedure Laterality Date  . ABDOMINAL HYSTERECTOMY  1992  . gallbladder removed  1998   Social History  Substance Use Topics  . Smoking status: Former Games developer  . Smokeless tobacco: Not on file  . Alcohol use Yes   family history includes Diabetes in her brother; Hypertension in her brother.  ROS as above:  Medications: Current Outpatient Prescriptions  Medication Sig Dispense Refill  . AMBULATORY NON FORMULARY MEDICATION Bayer Contour TS Blood glucose testing strips, use twice a day to test blood sugar.  Dx Type 2 diabetes 250.00 50 strip 11  . AMBULATORY NON FORMULARY MEDICATION Bayer Contour TS Blood glucose lancets, use twice daily to test blood sugar.  Dx Type 2 diabetes 250.00 50 Units 11  . aspirin 81 MG tablet Take 81 mg by mouth daily.    Marland Kitchen atorvastatin (LIPITOR) 10 MG tablet TAKE 1 TABLET EVERY DAY 90 tablet 0  . hydrochlorothiazide (HYDRODIURIL) 12.5 MG tablet Take 1 tablet (12.5 mg total) by mouth daily. 90 tablet 0  . losartan (COZAAR) 100 MG  tablet Take 1 tablet (100 mg total) by mouth daily. 90 tablet 0  . metFORMIN (GLUCOPHAGE) 500 MG tablet One by mouth every morning and one at dinner time. 180 tablet 2   No current facility-administered medications for this visit.    Allergies  Allergen Reactions  . Crestor [Rosuvastatin]     cough  . Nsaids   . Pravastatin     myalgias     Exam:  BP 134/85   Pulse 76   Wt 199 lb (90.3 kg)   BMI 29.82 kg/m  Gen: Well NAD HEENT: EOMI,  MMM Lungs: Normal work of breathing. CTABL Heart: RRR no MRG Abd: NABS, Soft. Nondistended, Nontender Exts: Brisk capillary refill, warm and well perfused.  Diabetic Foot Exam - Simple   Simple Foot Form Visual Inspection No deformities, no ulcerations, no other skin breakdown bilaterally:  Yes Sensation Testing Intact to touch and monofilament testing bilaterally:  Yes Pulse Check Posterior Tibialis and Dorsalis pulse intact bilaterally:  Yes Comments    Depression screen [facility] 2/9 07/16/2016 04/03/2014  Decreased Interest 0 0  Down, Depressed, Hopeless - 0  PHQ - 2 Score 0 0     Results for orders placed or performed in visit on 07/16/16 (from the past 24 hour(s))  POCT HgB A1C     Status: None   Collection Time: 07/16/16  9:09 AM  Result Value Ref Range   Hemoglobin A1C 7.0    No results found.    Assessment and Plan: 70 y.o. female with  1) diabetes: Doing well.  Continue current regimen. Check basic fasting labs. Recheck in 3 months.  2) hypertension: Also doing well. Check labs. Recheck in 3 months.  3) hyperlipidemia: Check lipids continue Lipitor.  4) knee pain: Improved. Continue following along as needed.  5) health maintenance: DEXA scan and hepatitis C testing ordered. Tdap given. Otherwise up-to-date. Additionally we'll check vitamin D   Orders Placed This Encounter  Procedures  . DG Bone Density    Standing Status:   Future    Standing Expiration Date:   09/16/2017    Order Specific Question:   Reason for  exam:    Answer:   eval bone density    Order Specific Question:   Preferred imaging location?    Answer:   [facility]  . Tdap vaccine greater than or equal to 7yo IM  . CBC  . Comprehensive metabolic panel    Order Specific Question:   Has the patient fasted?    Answer:   No  . Lipid panel    Order Specific Question:   Has the patient fasted?    Answer:   No  . Hepatitis C antibody  . VITAMIN D 25 Hydroxy (Vit-D Deficiency, Fractures)  . POCT HgB A1C    Discussed warning signs or symptoms. Please see discharge instructions. Patient expresses understanding.

## 2016-07-17 ENCOUNTER — Encounter: Payer: Self-pay | Admitting: Family Medicine

## 2016-07-17 DIAGNOSIS — E559 Vitamin D deficiency, unspecified: Secondary | ICD-10-CM | POA: Insufficient documentation

## 2016-07-17 LAB — HEPATITIS C ANTIBODY: HCV AB: NEGATIVE

## 2016-07-17 LAB — VITAMIN D 25 HYDROXY (VIT D DEFICIENCY, FRACTURES): Vit D, 25-Hydroxy: 16 ng/mL — ABNORMAL LOW (ref 30–100)

## 2016-07-21 ENCOUNTER — Ambulatory Visit (INDEPENDENT_AMBULATORY_CARE_PROVIDER_SITE_OTHER): Payer: Medicare HMO

## 2016-07-21 ENCOUNTER — Other Ambulatory Visit: Payer: Self-pay | Admitting: Family Medicine

## 2016-07-21 DIAGNOSIS — Z780 Asymptomatic menopausal state: Secondary | ICD-10-CM | POA: Diagnosis not present

## 2016-07-27 ENCOUNTER — Other Ambulatory Visit: Payer: Self-pay | Admitting: Family Medicine

## 2016-07-29 ENCOUNTER — Other Ambulatory Visit: Payer: Self-pay | Admitting: Family Medicine

## 2016-08-18 ENCOUNTER — Other Ambulatory Visit: Payer: Self-pay | Admitting: Family Medicine

## 2016-08-30 LAB — HM DIABETES EYE EXAM

## 2016-09-06 ENCOUNTER — Encounter: Payer: Self-pay | Admitting: Family Medicine

## 2016-09-16 ENCOUNTER — Ambulatory Visit (INDEPENDENT_AMBULATORY_CARE_PROVIDER_SITE_OTHER): Payer: Medicare HMO | Admitting: Family Medicine

## 2016-09-16 DIAGNOSIS — Z23 Encounter for immunization: Secondary | ICD-10-CM

## 2016-09-16 NOTE — Progress Notes (Signed)
Flu shot given LD without complications. 

## 2016-09-23 ENCOUNTER — Other Ambulatory Visit: Payer: Self-pay | Admitting: Family Medicine

## 2016-09-23 DIAGNOSIS — Z1239 Encounter for other screening for malignant neoplasm of breast: Secondary | ICD-10-CM

## 2016-09-27 ENCOUNTER — Other Ambulatory Visit: Payer: Self-pay | Admitting: Family Medicine

## 2016-09-30 ENCOUNTER — Ambulatory Visit: Payer: Medicare HMO

## 2016-10-19 ENCOUNTER — Encounter: Payer: Self-pay | Admitting: Family Medicine

## 2016-10-19 ENCOUNTER — Ambulatory Visit (INDEPENDENT_AMBULATORY_CARE_PROVIDER_SITE_OTHER): Payer: Medicare HMO | Admitting: Family Medicine

## 2016-10-19 VITALS — BP 147/78 | HR 88 | Wt 200.0 lb

## 2016-10-19 DIAGNOSIS — I10 Essential (primary) hypertension: Secondary | ICD-10-CM

## 2016-10-19 DIAGNOSIS — E785 Hyperlipidemia, unspecified: Secondary | ICD-10-CM | POA: Diagnosis not present

## 2016-10-19 DIAGNOSIS — E1121 Type 2 diabetes mellitus with diabetic nephropathy: Secondary | ICD-10-CM | POA: Diagnosis not present

## 2016-10-19 DIAGNOSIS — E11319 Type 2 diabetes mellitus with unspecified diabetic retinopathy without macular edema: Secondary | ICD-10-CM

## 2016-10-19 LAB — POCT GLYCOSYLATED HEMOGLOBIN (HGB A1C): Hemoglobin A1C: 7

## 2016-10-19 MED ORDER — LOSARTAN POTASSIUM 100 MG PO TABS: 100.0000 mg | ORAL_TABLET | Freq: Every day | ORAL | 3 refills | Status: DC

## 2016-10-19 MED ORDER — AMBULATORY NON FORMULARY MEDICATION: 11 refills | Status: DC

## 2016-10-19 MED ORDER — HYDROCHLOROTHIAZIDE 12.5 MG PO TABS: 12.5000 mg | ORAL_TABLET | Freq: Every day | ORAL | 3 refills | Status: DC

## 2016-10-19 NOTE — Progress Notes (Signed)
Lisa EhrichSarah Minasyan is a 70 y.o. female who [name] [facility] today for follow up diabetes, HTN and HLD.   1) Diabetes: Taking the medications listed below. She is doing well with no issues. She take the metformin daily. No fever, chills, NVD.  She received a diabetic eye exam in September which showed concern for glaucoma, but otherwise was normal.  2) hypertension: Doing well with hydrochlorothiazide and losartan. No chest pains palpitations or shortness of breath. She notes her blood pressure may be elevated today because she ate salty pork grinds yesterday.  3) hyperlipidemia: Doing well with atorvastatin 10 mg. Her last LDL was less than 100. She denies any muscle pains or aches.   Past Medical History:  Diagnosis Date  . Diabetes (HCC)   . Hyperlipidemia   . Hypertension    Past Surgical History:  Procedure Laterality Date  . ABDOMINAL HYSTERECTOMY  1992  . gallbladder removed  1998   Social History  Substance Use Topics  . Smoking status: Former Games developermoker  . Smokeless tobacco: Not on file  . Alcohol use Yes   family history includes Diabetes in her brother; Hypertension in her brother.  ROS as above:  Medications: Current Outpatient Prescriptions  Medication Sig Dispense Refill  . AMBULATORY NON FORMULARY MEDICATION Bayer Contour TS Blood glucose testing strips, use once a day to test blood sugar.  Dx Type 2 diabetes 250.00 50 strip 11  . AMBULATORY NON FORMULARY MEDICATION Bayer Contour TS Blood glucose lancets, use once daily to test blood sugar.  Dx Type 2 diabetes 250.00 50 Units 11  . aspirin 81 MG tablet Take 81 mg by mouth daily.    Marland Kitchen. atorvastatin (LIPITOR) 10 MG tablet TAKE 1 TABLET EVERY DAY 90 tablet 3  . hydrochlorothiazide (HYDRODIURIL) 12.5 MG tablet Take 1 tablet (12.5 mg total) by mouth daily. 90 tablet 3  . losartan (COZAAR) 100 MG  tablet Take 1 tablet (100 mg total) by mouth daily. 90 tablet 3  . metFORMIN (GLUCOPHAGE) 500 MG tablet One by mouth every morning and one at dinner time. 180 tablet 2   No current facility-administered medications for this visit.    Allergies  Allergen Reactions  . Crestor [Rosuvastatin]     cough  . Nsaids   . Pravastatin     myalgias    Health Maintenance Health Maintenance  Topic Date Due  . HEMOGLOBIN A1C  04/18/2017  . OPHTHALMOLOGY EXAM  08/30/2017  . FOOT EXAM  10/19/2017  . MAMMOGRAM  10/22/2017  . COLONOSCOPY  05/13/2024  . TETANUS/TDAP  07/16/2026  . INFLUENZA VACCINE  Completed  . DEXA SCAN  Completed  . ZOSTAVAX  Completed  . Hepatitis C Screening  Completed  . PNA vac Low Risk Adult  Completed     Exam:  BP (!) 147/78   Pulse 88   Wt 200 lb (90.7 kg)   BMI 29.97 kg/m  Gen: Well NAD HEENT: EOMI,  MMM Lungs: Normal work of breathing. CTABL Heart: RRR no MRG Abd: NABS, Soft. Nondistended, Nontender Exts: Brisk capillary refill, warm and well perfused.   Diabetic Foot Exam - Simple   Simple Foot Form Diabetic Foot exam was performed with the following findings:  Yes 10/19/2016  8:21 AM  Visual Inspection No deformities, no ulcerations, no other skin breakdown bilaterally:  Yes Sensation Testing Intact to touch and monofilament testing bilaterally:  Yes Pulse Check Posterior Tibialis and Dorsalis pulse  intact bilaterally:  Yes Comments       Results for orders placed or performed in visit on 10/19/16 (from the past 72 hour(s))  POCT HgB A1C     Status: None   Collection Time: 10/19/16  8:20 AM  Result Value Ref Range   Hemoglobin A1C 7.0    No results found.    Assessment and Plan: 70 y.o. female with   Diabetes: Doing well. Continue current regimen. Medications refilled.  Hypertension: Blood pressure elevated today on recheck as well. This is the first time it's been elevated in quite a while. Plan to recheck blood pressure in 3  months if pressure still elevated will likely increase hydrochlorothiazide dose.  Hyperlipidemia: Doing well. LDL rechecked a few months ago and was normal. Continue current regimen.   Orders Placed This Encounter  Procedures  . POCT HgB A1C    Discussed warning signs or symptoms. Please see discharge instructions. Patient expresses understanding.

## 2016-10-19 NOTE — Patient Instructions (Signed)
Thank you for coming in today. Return for recheck in 3 months.  Return sooner if needed.  Call or go to the emergency room if you get worse, have trouble breathing, have chest pains, or palpitations.

## 2016-10-26 ENCOUNTER — Ambulatory Visit (INDEPENDENT_AMBULATORY_CARE_PROVIDER_SITE_OTHER): Payer: Medicare HMO

## 2016-10-26 DIAGNOSIS — Z1239 Encounter for other screening for malignant neoplasm of breast: Secondary | ICD-10-CM

## 2016-10-26 DIAGNOSIS — Z1231 Encounter for screening mammogram for malignant neoplasm of breast: Secondary | ICD-10-CM

## 2017-01-19 ENCOUNTER — Ambulatory Visit (INDEPENDENT_AMBULATORY_CARE_PROVIDER_SITE_OTHER): Payer: Medicare HMO | Admitting: Family Medicine

## 2017-01-19 ENCOUNTER — Encounter: Payer: Self-pay | Admitting: Family Medicine

## 2017-01-19 VITALS — BP 147/95 | HR 69 | Wt 202.0 lb

## 2017-01-19 DIAGNOSIS — I493 Ventricular premature depolarization: Secondary | ICD-10-CM | POA: Insufficient documentation

## 2017-01-19 DIAGNOSIS — E11319 Type 2 diabetes mellitus with unspecified diabetic retinopathy without macular edema: Secondary | ICD-10-CM | POA: Diagnosis not present

## 2017-01-19 DIAGNOSIS — E785 Hyperlipidemia, unspecified: Secondary | ICD-10-CM

## 2017-01-19 DIAGNOSIS — I499 Cardiac arrhythmia, unspecified: Secondary | ICD-10-CM | POA: Diagnosis not present

## 2017-01-19 DIAGNOSIS — I10 Essential (primary) hypertension: Secondary | ICD-10-CM | POA: Diagnosis not present

## 2017-01-19 DIAGNOSIS — E1121 Type 2 diabetes mellitus with diabetic nephropathy: Secondary | ICD-10-CM

## 2017-01-19 DIAGNOSIS — E119 Type 2 diabetes mellitus without complications: Secondary | ICD-10-CM | POA: Diagnosis not present

## 2017-01-19 LAB — POCT GLYCOSYLATED HEMOGLOBIN (HGB A1C): HEMOGLOBIN A1C: 6.7

## 2017-01-19 MED ORDER — LOSARTAN POTASSIUM-HCTZ 100-25 MG PO TABS: 1.0000 | ORAL_TABLET | Freq: Every day | ORAL | 3 refills | Status: DC

## 2017-01-19 NOTE — Progress Notes (Signed)
Lisa EhrichSarah Phelps is a 71 y.o. female who [name] [facility] today for follow-up diabetes hypertension hyperlipidemia and discuss discovered irregular heartbeat today.  Diabetes: Doing well with metformin. Patient denies elevated blood sugars. No chest pain or shortness of breath polyuria or polydipsia.  Hypertension: Patient is doing well with losartan 100 mg daily and hydrochlorothiazide 12.5 mg daily. She denies lightheadedness or dizziness.  Hyperlipidemia: Patient takes Lipitor 10 mg daily. She denies muscle aches or pains and feels well.  Irregular heartbeat. Patient was found to have an irregular heartbeat on exam today. She denies significant palpitations and feels well with no lightheadedness or dizziness. She denies any personal history of irregular heart rate.   Past Medical History:  Diagnosis Date  . Diabetes (HCC)   . Hyperlipidemia   . Hypertension    Past Surgical History:  Procedure Laterality Date  . ABDOMINAL HYSTERECTOMY  1992  . gallbladder removed  1998   Social History  Substance Use Topics  . Smoking status: Former Games developermoker  . Smokeless tobacco: Never Used  . Alcohol use Yes   family history includes Diabetes in her brother; Hypertension in her brother.  ROS as above:  Medications: Current Outpatient Prescriptions  Medication Sig Dispense Refill  . AMBULATORY NON FORMULARY MEDICATION Bayer Contour TS Blood glucose testing strips, use once a day to test blood sugar.  Dx Type 2 diabetes 250.00 50 strip 11  . AMBULATORY NON FORMULARY MEDICATION Bayer Contour TS Blood glucose lancets, use once daily to test blood sugar.  Dx Type 2 diabetes 250.00 50 Units 11  . aspirin 81 MG tablet Take 81 mg by mouth daily.    Marland Kitchen. atorvastatin (LIPITOR) 10 MG tablet TAKE 1 TABLET EVERY DAY 90 tablet 3  . metFORMIN (GLUCOPHAGE) 500 MG tablet One by mouth  every morning and one at dinner time. 180 tablet 2  . losartan-hydrochlorothiazide (HYZAAR) 100-25 MG tablet Take 1 tablet by mouth daily. 90 tablet 3   No current facility-administered medications for this visit.    Allergies  Allergen Reactions  . Crestor [Rosuvastatin]     cough  . Nsaids   . Pravastatin     myalgias    Health Maintenance Health Maintenance  Topic Date Due  . HEMOGLOBIN A1C  04/18/2017  . OPHTHALMOLOGY EXAM  08/30/2017  . FOOT EXAM  10/19/2017  . MAMMOGRAM  10/26/2018  . COLONOSCOPY  05/13/2024  . TETANUS/TDAP  07/16/2026  . INFLUENZA VACCINE  Completed  . DEXA SCAN  Completed  . ZOSTAVAX  Completed  . Hepatitis C Screening  Completed  . PNA vac Low Risk Adult  Completed     Exam:  BP (!) 147/95   Pulse 69   Wt 202 lb (91.6 kg)   SpO2 98%   BMI 30.27 kg/m  Gen: Well NAD HEENT: EOMI,  MMM Lungs: Normal work of breathing. CTABL Heart: Irregular but normal rhythm no MRG Abd: NABS, Soft. Nondistended, Nontender Exts: Brisk capillary refill, warm and well perfused.   Twelve-lead EKG shows normal sinus rhythm at 70 bpm. No ST segment elevation or depression. Low voltage EKG. No prior studies to compare to. One PVC on EKG present.   Results for orders placed or performed in visit on 01/19/17 (from the past 72 hour(s))  POCT HgB A1C     Status: None   Collection Time: 01/19/17  8:38 AM  Result Value Ref Range   Hemoglobin A1C 6.7  No results found.    Assessment and Plan: 71 y.o. female with  Diabetes: Doing very well continue current regimen.  Hypertension: Blood pressure continues to be elevated. Will switch to losartan 100 and hydrochlorothiazide 25 mg combination pill.  Hyperlipidemia: Doing well tolerating medications well continue current regimen.  Irregular heartbeat likely PVC based on EKG today. No evidence of atrial fibrillation. Patient is a symptomatic and doing well at baseline. Continue current regimen and return sooner if  worsening. If patient starts having symptoms of the palpitations become more noticeable obtain a Holter monitor.   Orders Placed This Encounter  Procedures  . POCT HgB A1C   Meds ordered this encounter  Medications  . losartan-hydrochlorothiazide (HYZAAR) 100-25 MG tablet    Sig: Take 1 tablet by mouth daily.    Dispense:  90 tablet    Refill:  3     Discussed warning signs or symptoms. Please see discharge instructions. Patient expresses understanding.

## 2017-01-19 NOTE — Patient Instructions (Signed)
Thank you for coming in today. Return in 3 months.  Start new blood pressure pill.  STOP old blood pressure pills.   Let me know if you have worsening palpitations. \  Call or go to the emergency room if you get worse, have trouble breathing, have chest pains, or palpitations.

## 2017-01-20 NOTE — Addendum Note (Signed)
Addended by: Collie SiadICHARDSON, Jeaneane Adamec M on: 01/20/2017 10:28 AM   Modules accepted: Orders

## 2017-02-28 ENCOUNTER — Other Ambulatory Visit: Payer: Self-pay

## 2017-02-28 DIAGNOSIS — H1045 Other chronic allergic conjunctivitis: Secondary | ICD-10-CM | POA: Diagnosis not present

## 2017-02-28 DIAGNOSIS — H40003 Preglaucoma, unspecified, bilateral: Secondary | ICD-10-CM | POA: Diagnosis not present

## 2017-02-28 DIAGNOSIS — H25813 Combined forms of age-related cataract, bilateral: Secondary | ICD-10-CM | POA: Diagnosis not present

## 2017-02-28 DIAGNOSIS — E113293 Type 2 diabetes mellitus with mild nonproliferative diabetic retinopathy without macular edema, bilateral: Secondary | ICD-10-CM | POA: Diagnosis not present

## 2017-02-28 DIAGNOSIS — H527 Unspecified disorder of refraction: Secondary | ICD-10-CM | POA: Diagnosis not present

## 2017-02-28 LAB — HM DIABETES EYE EXAM

## 2017-02-28 MED ORDER — METFORMIN HCL 500 MG PO TABS: ORAL_TABLET | ORAL | 2 refills | Status: DC

## 2017-03-03 ENCOUNTER — Encounter: Payer: Self-pay | Admitting: Family Medicine

## 2017-04-18 ENCOUNTER — Ambulatory Visit (INDEPENDENT_AMBULATORY_CARE_PROVIDER_SITE_OTHER): Payer: Medicare HMO | Admitting: Family Medicine

## 2017-04-18 ENCOUNTER — Encounter: Payer: Self-pay | Admitting: Family Medicine

## 2017-04-18 VITALS — BP 135/74 | HR 73 | Temp 97.8°F | Wt 200.0 lb

## 2017-04-18 DIAGNOSIS — E785 Hyperlipidemia, unspecified: Secondary | ICD-10-CM

## 2017-04-18 DIAGNOSIS — E1121 Type 2 diabetes mellitus with diabetic nephropathy: Secondary | ICD-10-CM | POA: Diagnosis not present

## 2017-04-18 DIAGNOSIS — I10 Essential (primary) hypertension: Secondary | ICD-10-CM

## 2017-04-18 DIAGNOSIS — E559 Vitamin D deficiency, unspecified: Secondary | ICD-10-CM | POA: Diagnosis not present

## 2017-04-18 LAB — LIPID PANEL W/REFLEX DIRECT LDL
CHOL/HDL RATIO: 3.1 ratio (ref <5.0)
Cholesterol: 138 mg/dL (ref <200)
HDL: 44 mg/dL — AB (ref >50)
LDL-CHOLESTEROL: 77 mg/dL
NON-HDL CHOLESTEROL (CALC): 94 mg/dL (ref <130)
Triglycerides: 86 mg/dL (ref <150)

## 2017-04-18 LAB — COMPLETE METABOLIC PANEL WITH GFR
ALT: 16 U/L (ref 6–29)
AST: 39 U/L — AB (ref 10–35)
Albumin: 4.2 g/dL (ref 3.6–5.1)
Alkaline Phosphatase: 59 U/L (ref 33–130)
BUN: 12 mg/dL (ref 7–25)
CALCIUM: 9.6 mg/dL (ref 8.6–10.4)
CHLORIDE: 101 mmol/L (ref 98–110)
CO2: 26 mmol/L (ref 20–31)
Creat: 0.99 mg/dL — ABNORMAL HIGH (ref 0.60–0.93)
GFR, Est African American: 67 mL/min (ref >=60)
GFR, Est Non African American: 58 mL/min — ABNORMAL LOW (ref >=60)
Glucose, Bld: 115 mg/dL — ABNORMAL HIGH (ref 65–99)
Potassium: 3.9 mmol/L (ref 3.5–5.3)
Sodium: 140 mmol/L (ref 135–146)
TOTAL PROTEIN: 6.9 g/dL (ref 6.1–8.1)
Total Bilirubin: 0.3 mg/dL (ref 0.2–1.2)

## 2017-04-18 LAB — CBC
HEMATOCRIT: 42.2 % (ref 35.0–45.0)
HEMOGLOBIN: 13.7 g/dL (ref 11.7–15.5)
MCH: 30.4 pg (ref 27.0–33.0)
MCHC: 32.5 g/dL (ref 32.0–36.0)
MCV: 93.8 fL (ref 80.0–100.0)
MPV: 9.2 fL (ref 7.5–12.5)
Platelets: 298 10*3/uL (ref 140–400)
RBC: 4.5 MIL/uL (ref 3.80–5.10)
RDW: 14.2 % (ref 11.0–15.0)
WBC: 6.2 10*3/uL (ref 3.8–10.8)

## 2017-04-18 MED ORDER — LOSARTAN POTASSIUM-HCTZ 100-25 MG PO TABS
1.0000 | ORAL_TABLET | Freq: Every day | ORAL | 3 refills | Status: DC
Start: 2017-04-18 — End: 2018-02-09

## 2017-04-18 MED ORDER — ATORVASTATIN CALCIUM 10 MG PO TABS: 10.0000 mg | ORAL_TABLET | Freq: Every day | ORAL | 3 refills | Status: DC

## 2017-04-18 MED ORDER — METFORMIN HCL 500 MG PO TABS: ORAL_TABLET | ORAL | 2 refills | Status: DC

## 2017-04-18 NOTE — Patient Instructions (Signed)
Thank you for coming in today. Get labs  Recheck in 3 months.  Return sooner if needed.  Let me know if your stress gets worse.

## 2017-04-18 NOTE — Progress Notes (Signed)
Lisa Phelps is a 71 y.o. female who presents to [name]: [facility] today for follow-up diabetes hypertension hyperlipidemia. Patient is doing well with no significant life changes. She notes even under a bit of stress recently because her husband last week had a radical prostatectomy for prostate cancer. He is doing well as the cancer is confined to the prostate gland.   Diabetes patient takes metformin daily and denies polyuria polydipsia or increased blood sugars.  Hypertension patient is doing well taking olmesartan/hydrochlorothiazide daily with no chest pain palpitations shortness of breath or lightheadedness.  Lipids: Patient takes Lipitor daily denies any muscle aches or pains.   Past Medical History:  Diagnosis Date  . Diabetes (HCC)   . Hyperlipidemia   . Hypertension    Past Surgical History:  Procedure Laterality Date  . ABDOMINAL HYSTERECTOMY  1992  . gallbladder removed  1998   Social History  Substance Use Topics  . Smoking status: Former Games developer  . Smokeless tobacco: Never Used  . Alcohol use Yes   family history includes Diabetes in her brother; Hypertension in her brother.  ROS as above:  Medications: Current Outpatient Prescriptions  Medication Sig Dispense Refill  . AMBULATORY NON FORMULARY MEDICATION Bayer Contour TS Blood glucose testing strips, use once a day to test blood sugar.  Dx Type 2 diabetes 250.00 50 strip 11  . AMBULATORY NON FORMULARY MEDICATION Bayer Contour TS Blood glucose lancets, use once daily to test blood sugar.  Dx Type 2 diabetes 250.00 50 Units 11  . aspirin 81 MG tablet Take 81 mg by mouth daily.    Marland Kitchen atorvastatin (LIPITOR) 10 MG tablet Take 1 tablet (10 mg total) by mouth daily. 90 tablet 3  . losartan-hydrochlorothiazide (HYZAAR) 100-25 MG tablet Take 1 tablet by mouth daily. 90 tablet 3  . metFORMIN (GLUCOPHAGE) 500  MG tablet One by mouth every morning and one at dinner time. 180 tablet 2   No current facility-administered medications for this visit.    Allergies  Allergen Reactions  . Crestor [Rosuvastatin]     cough  . Nsaids   . Pravastatin     myalgias    Health Maintenance Health Maintenance  Topic Date Due  . INFLUENZA VACCINE  07/13/2017  . HEMOGLOBIN A1C  07/19/2017  . FOOT EXAM  10/19/2017  . OPHTHALMOLOGY EXAM  02/28/2018  . MAMMOGRAM  10/26/2018  . COLONOSCOPY  05/13/2024  . TETANUS/TDAP  07/16/2026  . DEXA SCAN  Completed  . Hepatitis C Screening  Completed  . PNA vac Low Risk Adult  Completed     Exam:  BP 135/74 (BP Location: Left Arm, Patient Position: Sitting, Cuff Size: Normal)   Pulse 73   Temp 97.8 F (36.6 C) (Oral)   Wt 200 lb (90.7 kg)   SpO2 98%   BMI 29.97 kg/m  Gen: Well NAD HEENT: EOMI,  MMM Lungs: Normal work of breathing. CTABL Heart: RRR no MRG Abd: NABS, Soft. Nondistended, Nontender Exts: Brisk capillary refill, warm and well perfused.    No results found for this or any previous visit (from the past 72 hour(s)). No results found.    Assessment and Plan: 71 y.o. female with  Diabetes doing well check A1c continue current regimen.  Hypertension at goal. Check metabolic panel continue losartan/hydrochlorothiazide.  Lipids: Doing well. Recheck lipid panel continue current regimen. Recheck in 3 months.  We'll check vitamin D levels as well.   Orders Placed  This Encounter  Procedures  . CBC  . COMPLETE METABOLIC PANEL WITH GFR  . Lipid Panel w/reflex Direct LDL  . Hemoglobin A1c  . VITAMIN D 25 Hydroxy (Vit-D Deficiency, Fractures)   Meds ordered this encounter  Medications  . atorvastatin (LIPITOR) 10 MG tablet    Sig: Take 1 tablet (10 mg total) by mouth daily.    Dispense:  90 tablet    Refill:  3  . losartan-hydrochlorothiazide (HYZAAR) 100-25 MG tablet    Sig: Take 1 tablet by mouth daily.    Dispense:  90 tablet     Refill:  3  . metFORMIN (GLUCOPHAGE) 500 MG tablet    Sig: One by mouth every morning and one at dinner time.    Dispense:  180 tablet    Refill:  2     Discussed warning signs or symptoms. Please see discharge instructions. Patient expresses understanding.

## 2017-04-18 NOTE — Progress Notes (Signed)
Pt is here for a 3 month follow up.

## 2017-04-19 LAB — VITAMIN D 25 HYDROXY (VIT D DEFICIENCY, FRACTURES): Vit D, 25-Hydroxy: 54 ng/mL (ref 30–100)

## 2017-04-19 LAB — HEMOGLOBIN A1C
HEMOGLOBIN A1C: 6.6 % — AB (ref <5.7)
MEAN PLASMA GLUCOSE: 143 mg/dL

## 2017-05-12 ENCOUNTER — Ambulatory Visit (INDEPENDENT_AMBULATORY_CARE_PROVIDER_SITE_OTHER): Payer: Medicare HMO | Admitting: Family Medicine

## 2017-05-12 ENCOUNTER — Encounter: Payer: Self-pay | Admitting: Family Medicine

## 2017-05-12 VITALS — BP 140/86 | HR 97 | Temp 98.0°F | Wt 201.0 lb

## 2017-05-12 DIAGNOSIS — R05 Cough: Secondary | ICD-10-CM | POA: Diagnosis not present

## 2017-05-12 DIAGNOSIS — R059 Cough, unspecified: Secondary | ICD-10-CM

## 2017-05-12 MED ORDER — BENZONATATE 200 MG PO CAPS: 200.0000 mg | ORAL_CAPSULE | Freq: Three times a day (TID) | ORAL | 0 refills | Status: DC | PRN

## 2017-05-12 NOTE — Progress Notes (Signed)
Lisa Phelps is a 71 y.o. female who presents to [name]: Primary [facility] Medicine today for cough lasting 5 days.  She came down with a cold 12 days ago starting with a sore throat that resolved before the cough begun.  She has tried Coricidin and Mucinex DM but hasn't taken anything since Sat.  She thought the coricidin made her feel groggy and would like to avoid it.  She had been around her daughter who was ill with similar symptoms for around 1.5 weeks.  She has been feeling somewhat better and comes in today since she has a funeral this weekend and doesn't want to be coughing during it.  She endorses headaches that worsen with standing starting with the cough, chronic SOB and denies chest pain, chills, N/V, dizziness.   Past Medical History:  Diagnosis Date  . Diabetes (HCC)   . Hyperlipidemia   . Hypertension    Past Surgical History:  Procedure Laterality Date  . ABDOMINAL HYSTERECTOMY  1992  . gallbladder removed  1998   Social History  Substance Use Topics  . Smoking status: Former Games developer  . Smokeless tobacco: Never Used  . Alcohol use Yes   family history includes Diabetes in her brother; Hypertension in her brother.  ROS as above:  Medications: Current Outpatient Prescriptions  Medication Sig Dispense Refill  . AMBULATORY NON FORMULARY MEDICATION Bayer Contour TS Blood glucose testing strips, use once a day to test blood sugar.  Dx Type 2 diabetes 250.00 50 strip 11  . AMBULATORY NON FORMULARY MEDICATION Bayer Contour TS Blood glucose lancets, use once daily to test blood sugar.  Dx Type 2 diabetes 250.00 50 Units 11  . aspirin 81 MG tablet Take 81 mg by mouth daily.    Marland Kitchen atorvastatin (LIPITOR) 10 MG tablet Take 1 tablet (10 mg total) by mouth daily. 90 tablet 3  . losartan-hydrochlorothiazide (HYZAAR) 100-25 MG tablet Take 1 tablet by mouth daily. 90 tablet 3  .  metFORMIN (GLUCOPHAGE) 500 MG tablet One by mouth every morning and one at dinner time. 180 tablet 2  . benzonatate (TESSALON) 200 MG capsule Take 1 capsule (200 mg total) by mouth 3 (three) times daily as needed for cough. 45 capsule 0   No current facility-administered medications for this visit.    Allergies  Allergen Reactions  . Crestor [Rosuvastatin]     cough  . Nsaids   . Pravastatin     myalgias    Health Maintenance Health Maintenance  Topic Date Due  . INFLUENZA VACCINE  07/13/2017  . FOOT EXAM  10/19/2017  . HEMOGLOBIN A1C  10/19/2017  . OPHTHALMOLOGY EXAM  02/28/2018  . MAMMOGRAM  10/26/2018  . COLONOSCOPY  05/13/2024  . TETANUS/TDAP  07/16/2026  . DEXA SCAN  Completed  . Hepatitis C Screening  Completed  . PNA vac Low Risk Adult  Completed     Exam:  BP 140/86   Pulse 97   Temp 98 F (36.7 C) (Oral)   Wt 201 lb (91.2 kg)   SpO2 97%   BMI 30.12 kg/m  Gen: Well NAD non-toxic appearing HEENT: No sinus tenderness. Normal posterior pharynx Lungs: Normal work of breathing. CTABL Heart: RR, occasional PVCs no MRG Abd: NABS, Soft. Nondistended, Nontender Exts: Brisk capillary refill, warm and well perfused.    No results found for this or any previous visit (from the past 72 hour(s)). No results found.    Assessment and Plan:  71 y.o. female with 5 days history of cough following cold symptoms a week before likely due to acute bronchitis.  This is less likely to be pneumonia without presence of fever and clear pulmonary exam. Prior history of PVCs, no acute cardiac changes suspected.  Patient would like to avoid medications that might make her groggy.  Symptomatic treatment with tessalon pearls, cough drops and dextromethorphan for cough suppression and encourage rest and fluids.     No orders of the defined types were placed in this encounter.  Meds ordered this encounter  Medications  . benzonatate (TESSALON) 200 MG capsule    Sig: Take 1 capsule  (200 mg total) by mouth 3 (three) times daily as needed for cough.    Dispense:  45 capsule    Refill:  0     Discussed warning signs or symptoms. Please see discharge instructions. Patient expresses understanding.

## 2017-05-12 NOTE — Patient Instructions (Signed)
Thank you for coming in today. Continue the over the counter cough medicines as needed.  Take tessalon pearles as needed every 8 hours.  Use the sugar free cough drops.  Return or call if worsening or not improving.    Cough, Adult Coughing is a reflex that clears your throat and your airways. Coughing helps to heal and protect your lungs. It is normal to cough occasionally, but a cough that happens with other symptoms or lasts a long time may be a sign of a condition that needs treatment. A cough may last only 2-3 weeks (acute), or it may last longer than 8 weeks (chronic). What are the causes? Coughing is commonly caused by:  Breathing in substances that irritate your lungs.  A viral or bacterial respiratory infection.  Allergies.  Asthma.  Postnasal drip.  Smoking.  Acid backing up from the stomach into the esophagus (gastroesophageal reflux).  Certain medicines.  Chronic lung problems, including COPD (or rarely, lung cancer).  Other medical conditions such as heart failure.  Follow these instructions at home: Pay attention to any changes in your symptoms. Take these actions to help with your discomfort:  Take medicines only as told by your health care provider. ? If you were prescribed an antibiotic medicine, take it as told by your health care provider. Do not stop taking the antibiotic even if you start to feel better. ? Talk with your health care provider before you take a cough suppressant medicine.  Drink enough fluid to keep your urine clear or pale yellow.  If the air is dry, use a cold steam vaporizer or humidifier in your bedroom or your home to help loosen secretions.  Avoid anything that causes you to cough at work or at home.  If your cough is worse at night, try sleeping in a semi-upright position.  Avoid cigarette smoke. If you smoke, quit smoking. If you need help quitting, ask your health care provider.  Avoid caffeine.  Avoid alcohol.  Rest as  needed.  Contact a health care provider if:  You have new symptoms.  You cough up pus.  Your cough does not get better after 2-3 weeks, or your cough gets worse.  You cannot control your cough with suppressant medicines and you are losing sleep.  You develop pain that is getting worse or pain that is not controlled with pain medicines.  You have a fever.  You have unexplained weight loss.  You have night sweats. Get help right away if:  You cough up blood.  You have difficulty breathing.  Your heartbeat is very fast. This information is not intended to replace advice given to you by your health care provider. Make sure you discuss any questions you have with your health care provider. Document Released: 05/28/2011 Document Revised: 05/06/2016 Document Reviewed: 02/05/2015 Elsevier Interactive Patient Education  2017 [employer]

## 2017-07-19 ENCOUNTER — Ambulatory Visit (INDEPENDENT_AMBULATORY_CARE_PROVIDER_SITE_OTHER): Payer: Medicare HMO | Admitting: Family Medicine

## 2017-07-19 ENCOUNTER — Encounter: Payer: Self-pay | Admitting: Family Medicine

## 2017-07-19 VITALS — BP 129/71 | HR 75 | Wt 201.0 lb

## 2017-07-19 DIAGNOSIS — E785 Hyperlipidemia, unspecified: Secondary | ICD-10-CM | POA: Diagnosis not present

## 2017-07-19 DIAGNOSIS — E1121 Type 2 diabetes mellitus with diabetic nephropathy: Secondary | ICD-10-CM | POA: Diagnosis not present

## 2017-07-19 DIAGNOSIS — I10 Essential (primary) hypertension: Secondary | ICD-10-CM | POA: Diagnosis not present

## 2017-07-19 DIAGNOSIS — E11319 Type 2 diabetes mellitus with unspecified diabetic retinopathy without macular edema: Secondary | ICD-10-CM | POA: Diagnosis not present

## 2017-07-19 LAB — POCT GLYCOSYLATED HEMOGLOBIN (HGB A1C): Hemoglobin A1C: 7.1

## 2017-07-19 MED ORDER — ZOSTER VAC RECOMB ADJUVANTED 50 MCG/0.5ML IM SUSR: 0.5000 mL | Freq: Once | INTRAMUSCULAR | 1 refills | Status: AC

## 2017-07-19 NOTE — Patient Instructions (Signed)
Thank you for coming in today. Continue current medicine.  You should get the shingles vaccine in the near future.  Get the flu shot as well.  Let me know when you get it.   Recheck with me in 3 month.

## 2017-07-19 NOTE — Progress Notes (Signed)
Lisa Phelps is a 71 y.o. female who presents to [facility]: Primary Care Sports Medicine today for follow-up diabetes hyperlipidemia and hypertension.  Diabetes: Patient is doing well with metformin daily. She denies any polyuria polydipsia or hypoglycemic episodes. She notes that she has not been very adherent to a strict diabetic diet recently. She notes her blood sugars are typically well controlled.  Hyperlipidemia: Patient takes atorvastatin daily she denies any muscle pains or aches and feels well.  Hypertension: Patient takes losartan/hydrochlorothiazide daily and feels well with no chest pain palpitations shortness of breath lightheadedness or dizziness.  Patient is also interested in the shingles vaccine.   Past Medical History:  Diagnosis Date  . Diabetes (HCC)   . Hyperlipidemia   . Hypertension    Past Surgical History:  Procedure Laterality Date  . ABDOMINAL HYSTERECTOMY  1992  . gallbladder removed  1998   Social History  Substance Use Topics  . Smoking status: Former Games developer  . Smokeless tobacco: Never Used  . Alcohol use Yes   family history includes Diabetes in her brother and unknown relative; Hyperlipidemia in her unknown relative; Hypertension in her brother and unknown relative.  ROS as above:  Medications: Current Outpatient Prescriptions  Medication Sig Dispense Refill  . AMBULATORY NON FORMULARY MEDICATION Bayer Contour TS Blood glucose testing strips, use once a day to test blood sugar.  Dx Type 2 diabetes 250.00 50 strip 11  . AMBULATORY NON FORMULARY MEDICATION Bayer Contour TS Blood glucose lancets, use once daily to test blood sugar.  Dx Type 2 diabetes 250.00 50 Units 11  . aspirin 81 MG tablet Take 81 mg by mouth daily.    Marland Kitchen atorvastatin (LIPITOR) 10 MG tablet Take 1 tablet (10 mg total) by mouth daily. 90 tablet 3  . losartan-hydrochlorothiazide  (HYZAAR) 100-25 MG tablet Take 1 tablet by mouth daily. 90 tablet 3  . metFORMIN (GLUCOPHAGE) 500 MG tablet One by mouth every morning and one at dinner time. 180 tablet 2  . Zoster Vac Recomb Adjuvanted [facility]) injection Inject 0.5 mLs into the muscle once. Give again after 2 months. If administered in clinic please fax report to Dr Denyse Amass [phone] 1 each 1   No current facility-administered medications for this visit.    Allergies  Allergen Reactions  . Crestor [Rosuvastatin]     cough  . Nsaids   . Pravastatin     myalgias    Health Maintenance Health Maintenance  Topic Date Due  . INFLUENZA VACCINE  07/13/2017  . FOOT EXAM  10/19/2017  . HEMOGLOBIN A1C  10/19/2017  . OPHTHALMOLOGY EXAM  02/28/2018  . MAMMOGRAM  10/26/2018  . COLONOSCOPY  05/13/2024  . TETANUS/TDAP  07/16/2026  . DEXA SCAN  Completed  . Hepatitis C Screening  Completed  . PNA vac Low Risk Adult  Completed     Exam:  BP 129/71   Pulse 75   Wt 201 lb (91.2 kg)   BMI 30.12 kg/m  Gen: Well NAD HEENT: EOMI,  MMM Lungs: Normal work of breathing. CTABL Heart: RRR no MRG Abd: NABS, Soft. Nondistended, Nontender Exts: Brisk capillary refill, warm and well perfused.    Results for orders placed or performed in visit on 07/19/17 (from the past 72 hour(s))  POCT glycosylated hemoglobin (Hb A1C)     Status: None   Collection Time: 07/19/17  9:24 AM  Result Value Ref Range   Hemoglobin A1C 7.1    No results  found.    Assessment and Plan: 71 y.o. female with  Diabetes doing well continue current regimen. Recheck in 3 months.  Hypertension blood pressure at goal creatinine recently checked and normal. Continue current regimen.  Hyperlipidemia doing well. Lipids recently checked at goal plan to continue current regimen.  Shingles vaccine prescribed.   Orders Placed This Encounter  Procedures  . POCT glycosylated hemoglobin (Hb A1C)   Meds ordered this encounter  Medications  . Zoster  Vac Recomb Adjuvanted [facility](SHINGRIX) injection    Sig: Inject 0.5 mLs into the muscle once. Give again after 2 months. If administered in clinic please fax report to Dr Denyse Amassorey [phone]    Dispense:  1 each    Refill:  1     Discussed warning signs or symptoms. Please see discharge instructions. Patient expresses understanding.

## 2017-09-19 ENCOUNTER — Other Ambulatory Visit: Payer: Self-pay | Admitting: Family Medicine

## 2017-09-19 DIAGNOSIS — Z1231 Encounter for screening mammogram for malignant neoplasm of breast: Secondary | ICD-10-CM

## 2017-10-19 ENCOUNTER — Ambulatory Visit: Payer: Medicare HMO | Admitting: Family Medicine

## 2017-10-19 ENCOUNTER — Encounter: Payer: Self-pay | Admitting: Family Medicine

## 2017-10-19 VITALS — BP 124/85 | HR 94 | Wt 204.0 lb

## 2017-10-19 DIAGNOSIS — I10 Essential (primary) hypertension: Secondary | ICD-10-CM

## 2017-10-19 DIAGNOSIS — E1121 Type 2 diabetes mellitus with diabetic nephropathy: Secondary | ICD-10-CM | POA: Diagnosis not present

## 2017-10-19 DIAGNOSIS — F411 Generalized anxiety disorder: Secondary | ICD-10-CM

## 2017-10-19 DIAGNOSIS — E785 Hyperlipidemia, unspecified: Secondary | ICD-10-CM | POA: Diagnosis not present

## 2017-10-19 LAB — POCT GLYCOSYLATED HEMOGLOBIN (HGB A1C): Hemoglobin A1C: 7

## 2017-10-19 MED ORDER — SERTRALINE HCL 50 MG PO TABS: ORAL_TABLET | ORAL | 1 refills | Status: DC

## 2017-10-19 NOTE — Progress Notes (Signed)
Lisa Phelps is a 71 y.o. female who [facility] to [facility]: Primary Care Sports Medicine today for follow-up diabetes hypertension hyperlipidemia and discuss worsening anxiety.  Diabetes well controlled with the medications listed below with no episodes of hyperglycemic or hypoglycemic episodes.  No polyuria polydipsia.  She feels great.  Hypertension well controlled with medications listed below.  No chest pain palpitations or shortness of breath.  Hyperlipidemia doing well with atorvastatin with no significant muscle aches or pains.  Anxiety: Lisa Phelps notes worsening anxiety symptoms.  She has lots of stress at home.  She is caring for her husband who was recently diagnosed cancer as well as several grandchildren.  She notes that she is somewhat overwhelmed and has symptoms.  She is not sleeping well.  She would like to avoid medications that will make her feel tired.  She does not feel that she has time to go to counseling at this time.   Past Medical History:  Diagnosis Date  . Diabetes (HCC)   . Hyperlipidemia   . Hypertension    Past Surgical History:  Procedure Laterality Date  . ABDOMINAL HYSTERECTOMY  1992  . gallbladder removed  1998   Social History   Tobacco Use  . Smoking status: Former Games developermoker  . Smokeless tobacco: Never Used  Substance Use Topics  . Alcohol use: Yes   family history includes Diabetes in her brother and unknown relative; Hyperlipidemia in her unknown relative; Hypertension in her brother and unknown relative.  ROS as above:  Medications: Current Outpatient Medications  Medication Sig Dispense Refill  . AMBULATORY NON FORMULARY MEDICATION Bayer Contour TS Blood glucose testing strips, use once a day to test blood sugar.  Dx Type 2 diabetes 250.00 50 strip 11  . AMBULATORY NON FORMULARY MEDICATION Bayer Contour TS Blood glucose lancets, use once daily to test  blood sugar.  Dx Type 2 diabetes 250.00 50 Units 11  . aspirin 81 MG tablet Take 81 mg by mouth daily.    Marland Kitchen. atorvastatin (LIPITOR) 10 MG tablet Take 1 tablet (10 mg total) by mouth daily. 90 tablet 3  . losartan-hydrochlorothiazide (HYZAAR) 100-25 MG tablet Take 1 tablet by mouth daily. 90 tablet 3  . metFORMIN (GLUCOPHAGE) 500 MG tablet One by mouth every morning and one at dinner time. 180 tablet 2  . sertraline (ZOLOFT) 50 MG tablet 1/2 by mouth daily for 1 week, then increase to 1 pill by mouth daily for 1 week, then increase to 2 pill by mouth daily. Follow up in 3 weeks 40 tablet 1   No current facility-administered medications for this visit.    Allergies  Allergen Reactions  . Crestor [Rosuvastatin]     cough  . Nsaids   . Pravastatin     myalgias    Health Maintenance Health Maintenance  Topic Date Due  . FOOT EXAM  10/19/2017  . HEMOGLOBIN A1C  01/19/2018  . OPHTHALMOLOGY EXAM  02/28/2018  . MAMMOGRAM  10/26/2018  . COLONOSCOPY  05/13/2024  . TETANUS/TDAP  07/16/2026  . INFLUENZA VACCINE  Completed  . DEXA SCAN  Completed  . Hepatitis C Screening  Completed  . PNA vac Low Risk Adult  Completed     Exam:  BP 124/85   Pulse 94   Wt 204 lb (92.5 kg)   BMI 30.57 kg/m  Gen: Well NAD HEENT: EOMI,  MMM Lungs: Normal work of breathing. CTABL Heart: RRR no MRG Abd: NABS, Soft. Nondistended, Nontender  Exts: Brisk capillary refill, warm and well perfused.  Psych alert and oriented normal speech thought process and affect.  No SI or HI expressed.  Dep[facility] screen [facility] 2[facility] 07/19/2017 07/16/2016 04/03/2014  Decreased Interest 1 0 0  Down, Depressed, Hopeless 1 - 0  PHQ - 2 Score 2 0 0  Altered sleeping 1 - -  Tired, decreased energy 1 - -  Change in appetite 0 - -  Feeling bad or failure about yourself  0 - -  Trouble concentrating 0 - -  Moving slowly or fidgety/restless 0 - -  Suicidal thoughts 0 - -  PHQ-9 Score 4 - -  Difficult doing work/chores Somewhat  difficult - -   GAD 7 : Generalized Anxiety Score 10/19/2017  Nervous, Anxious, on Edge 3  Control/stop worrying 3  Worry too much - different things 3  Trouble relaxing 3  Restless 3  Easily annoyed or irritable 3  Afraid - awful might happen 3  Total GAD 7 Score 21  Anxiety Difficulty Very difficult       Diabetic Foot Exam - Simple   Simple Foot Form Diabetic Foot exam was performed with the following findings:  Yes 10/19/2017  9:21 AM  Visual Inspection No deformities, no ulcerations, no other skin breakdown bilaterally:  Yes Sensation Testing Intact to touch and monofilament testing bilaterally:  Yes Pulse Check Posterior Tibialis and Dorsalis pulse intact bilaterally:  Yes Comments      Results for orders placed or performed in visit on 10/19/17 (from the past 72 hour(s))  POCT HgB A1C     Status: None   Collection Time: 10/19/17  9:10 AM  Result Value Ref Range   Hemoglobin A1C 7.0    No results found.    Assessment and Plan: 71 y.o. female with anxiety: Patient is worsening anxiety symptoms.  Plan to start treatment with Zoloft discussion with patient.  Plan to use titration starting at 25 getting to 100 mg over the next few weeks.  Recheck in 3 weeks.   DM: Doing well A1c at goal continue current regimen.  Hypertension: Doing well blood pressure at goal continue medication management.  Hyperlipidemia: Doing quite well continuing to tolerate statins recheck as needed.   Orders Placed This Encounter  Procedures  . POCT HgB A1C   Meds ordered this encounter  Medications  . sertraline (ZOLOFT) 50 MG tablet    Sig: 1/2 by mouth daily for 1 week, then increase to 1 pill by mouth daily for 1 week, then increase to 2 pill by mouth daily. Follow up in 3 weeks    Dispense:  40 tablet    Refill:  1     Discussed warning signs or symptoms. Please see discharge instructions. Patient expresses understanding.

## 2017-10-19 NOTE — Patient Instructions (Signed)
Thank you for coming in today. Start zoloft 1/2 pill for 1 week.  Increase to 1 pill per day then 2 pills every week.  Recheck with me in 3 weeks. Return sooner if needed.  Let me know if you are feeling strange or having problem.   Sertraline tablets What is this medicine? SERTRALINE (SER tra leen) is used to treat depression. It may also be used to treat obsessive compulsive disorder, panic disorder, post-trauma stress, premenstrual dysphoric disorder (PMDD) or social anxiety. This medicine may be used for other purposes; ask your health care provider or pharmacist if you have questions. COMMON BRAND NAME(S): Zoloft What should I tell my health care provider before I take this medicine? They need to know if you have any of these conditions: -bleeding disorders -bipolar disorder or a family history of bipolar disorder -glaucoma -heart disease -high blood pressure -history of irregular heartbeat -history of low levels of calcium, magnesium, or potassium in the blood -if you often drink alcohol -liver disease -receiving electroconvulsive therapy -seizures -suicidal thoughts, plans, or attempt; a previous suicide attempt by you or a family member -take medicines that treat or prevent blood clots -thyroid disease -an unusual or allergic reaction to sertraline, other medicines, foods, dyes, or preservatives -pregnant or trying to get pregnant -breast-feeding How should I use this medicine? Take this medicine by mouth with a glass of water. Follow the directions on the prescription label. You can take it with or without food. Take your medicine at regular intervals. Do not take your medicine more often than directed. Do not stop taking this medicine suddenly except upon the advice of your doctor. Stopping this medicine too quickly may cause serious side effects or your condition may worsen. A special MedGuide will be given to you by the pharmacist with each prescription and refill. Be  sure to read this information carefully each time. Talk to your pediatrician regarding the use of this medicine in children. While this drug may be prescribed for children as young as 7 years for selected conditions, precautions do apply. Overdosage: If you think you have taken too much of this medicine contact a poison control center or emergency room at once. NOTE: This medicine is only for you. Do not share this medicine with others. What if I miss a dose? If you miss a dose, take it as soon as you can. If it is almost time for your next dose, take only that dose. Do not take double or extra doses. What may interact with this medicine? Do not take this medicine with any of the following medications: -cisapride -dofetilide -dronedarone -linezolid -MAOIs like Carbex, Eldepryl, Marplan, Nardil, and Parnate -methylene blue (injected into a vein) -pimozide -thioridazine This medicine may also interact with the following medications: -alcohol -amphetamines -aspirin and aspirin-like medicines -certain medicines for depression, anxiety, or psychotic disturbances -certain medicines for fungal infections like ketoconazole, fluconazole, posaconazole, and itraconazole -certain medicines for irregular heart beat like flecainide, quinidine, propafenone -certain medicines for migraine headaches like almotriptan, eletriptan, frovatriptan, naratriptan, rizatriptan, sumatriptan, zolmitriptan -certain medicines for sleep -certain medicines for seizures like carbamazepine, valproic acid, phenytoin -certain medicines that treat or prevent blood clots like warfarin, enoxaparin, dalteparin -cimetidine -digoxin -diuretics -fentanyl -isoniazid -lithium -NSAIDs, medicines for pain and inflammation, like ibuprofen or naproxen -other medicines that prolong the QT interval (cause an abnormal heart rhythm) -rasagiline -safinamide -supplements like St. John's wort, kava kava,  valerian -tolbutamide -tramadol -tryptophan This list may not describe all possible interactions. Give your health  care provider a list of all the medicines, herbs, non-prescription drugs, or dietary supplements you use. Also tell them if you smoke, drink alcohol, or use illegal drugs. Some items may interact with your medicine. What should I watch for while using this medicine? Tell your doctor if your symptoms do not get better or if they get worse. Visit your doctor or health care professional for regular checks on your progress. Because it may take several weeks to see the full effects of this medicine, it is important to continue your treatment as prescribed by your doctor. Patients and their families should watch out for new or worsening thoughts of suicide or depression. Also watch out for sudden changes in feelings such as feeling anxious, agitated, panicky, irritable, hostile, aggressive, impulsive, severely restless, overly excited and hyperactive, or not being able to sleep. If this happens, especially at the beginning of treatment or after a change in dose, call your health care professional. Bonita QuinYou may get drowsy or dizzy. Do not drive, use machinery, or do anything that needs mental alertness until you know how this medicine affects you. Do not stand or sit up quickly, especially if you are an older patient. This reduces the risk of dizzy or fainting spells. Alcohol may interfere with the effect of this medicine. Avoid alcoholic drinks. Your mouth may get dry. Chewing sugarless gum or sucking hard candy, and drinking plenty of water may help. Contact your doctor if the problem does not go away or is severe. What side effects may I notice from receiving this medicine? Side effects that you should report to your doctor or health care professional as soon as possible: -allergic reactions like skin rash, itching or hives, swelling of the face, lips, or tongue -anxious -black, tarry  stools -changes in vision -confusion -elevated mood, decreased need for sleep, racing thoughts, impulsive behavior -eye pain -fast, irregular heartbeat -feeling faint or lightheaded, falls -feeling agitated, angry, or irritable -hallucination, loss of contact with reality -loss of balance or coordination -loss of memory -painful or prolonged erections -restlessness, pacing, inability to keep still -seizures -stiff muscles -suicidal thoughts or other mood changes -trouble sleeping -unusual bleeding or bruising -unusually weak or tired -vomiting Side effects that usually do not require medical attention (report to your doctor or health care professional if they continue or are bothersome): -change in appetite or weight -change in sex drive or performance -diarrhea -increased sweating -indigestion, nausea -tremors This list may not describe all possible side effects. Call your doctor for medical advice about side effects. You may report side effects to FDA at 1-800-FDA-1088. Where should I keep my medicine? Keep out of the reach of children. Store at room temperature between 15 and 30 degrees C (59 and 86 degrees F). Throw away any unused medicine after the expiration date. NOTE: This sheet is a summary. It may not cover all possible information. If you have questions about this medicine, talk to your doctor, pharmacist, or health care provider.  2018 Elsevier/Gold Standard (2016-12-03 14:17:49)

## 2017-10-28 ENCOUNTER — Ambulatory Visit (INDEPENDENT_AMBULATORY_CARE_PROVIDER_SITE_OTHER): Payer: Medicare HMO

## 2017-10-28 DIAGNOSIS — Z1231 Encounter for screening mammogram for malignant neoplasm of breast: Secondary | ICD-10-CM | POA: Diagnosis not present

## 2017-11-09 ENCOUNTER — Encounter: Payer: Self-pay | Admitting: Family Medicine

## 2017-11-09 ENCOUNTER — Ambulatory Visit: Payer: Medicare HMO | Admitting: Family Medicine

## 2017-11-09 VITALS — BP 142/83 | HR 93

## 2017-11-09 DIAGNOSIS — E1121 Type 2 diabetes mellitus with diabetic nephropathy: Secondary | ICD-10-CM | POA: Diagnosis not present

## 2017-11-09 DIAGNOSIS — R251 Tremor, unspecified: Secondary | ICD-10-CM | POA: Diagnosis not present

## 2017-11-09 LAB — GLUCOSE, POCT (MANUAL RESULT ENTRY): POC Glucose: 210 mg/dl — AB (ref 70–99)

## 2017-11-09 MED ORDER — SERTRALINE HCL 100 MG PO TABS: 100.0000 mg | ORAL_TABLET | Freq: Every day | ORAL | 1 refills | Status: DC

## 2017-11-09 NOTE — Patient Instructions (Addendum)
Thank you for coming in today. Continue 100mg  zoloft daily.  Continue to watch out for jittery feelings.  Keep track of the sugars with jittery.  I think this is probably a one off event.   Recheck in about 3 months.  Let me know if you are not doing well.   Call your pharmacy about a possible recall of the Losartan/HCTZ

## 2017-11-09 NOTE — Progress Notes (Signed)
Lisa EhrichSarah Phelps is a 71 y.o. female who [facility] [place] [facility] today for follow-up anxiety.  Lisa SagoSarah was seen November 7 for generalized anxiety disorder.  She was started on Zoloft and has tapered up to 100 mg.  She is feeling a lot better and is completely asymptomatic today.  She is astonished at how well she is feeling.  She also notes a episode of jitteriness occurring this morning.  She woke up and felt jittery and suspected hypoglycemia.  She ate some sugary foods and then presented to clinic where her blood sugar was 200.  She is feeling fine now.  She denies any episodes of hypoglycemia previously.  She continues to take metformin listed below.  She has never had this happen before.   Past Medical History:  Diagnosis Date  . Diabetes (HCC)   . Hyperlipidemia   . Hypertension    Past Surgical History:  Procedure Laterality Date  . ABDOMINAL HYSTERECTOMY  1992  . gallbladder removed  1998   Social History   Tobacco Use  . Smoking status: Former Games developermoker  . Smokeless tobacco: Never Used  Substance Use Topics  . Alcohol use: Yes   family history includes Diabetes in her brother and unknown relative; Hyperlipidemia in her unknown relative; Hypertension in her brother and unknown relative.  ROS as above:  Medications: Current Outpatient Medications  Medication Sig Dispense Refill  . AMBULATORY NON FORMULARY MEDICATION Bayer Contour TS Blood glucose testing strips, use once a day to test blood sugar.  Dx Type 2 diabetes 250.00 50 strip 11  . AMBULATORY NON FORMULARY MEDICATION Bayer Contour TS Blood glucose lancets, use once daily to test blood sugar.  Dx Type 2 diabetes 250.00 50 Units 11  . aspirin 81 MG tablet Take 81 mg by mouth daily.    Marland Kitchen. atorvastatin (LIPITOR) 10 MG tablet Take 1 tablet (10 mg total) by mouth daily. 90 tablet 3  .  losartan-hydrochlorothiazide (HYZAAR) 100-25 MG tablet Take 1 tablet by mouth daily. 90 tablet 3  . metFORMIN (GLUCOPHAGE) 500 MG tablet One by mouth every morning and one at dinner time. 180 tablet 2  . sertraline (ZOLOFT) 50 MG tablet 1/2 by mouth daily for 1 week, then increase to 1 pill by mouth daily for 1 week, then increase to 2 pill by mouth daily. Follow up in 3 weeks 40 tablet 1   No current facility-administered medications for this visit.    Allergies  Allergen Reactions  . Crestor [Rosuvastatin]     cough  . Nsaids   . Pravastatin     myalgias    Health Maintenance Health Maintenance  Topic Date Due  . OPHTHALMOLOGY EXAM  02/28/2018  . HEMOGLOBIN A1C  04/18/2018  . FOOT EXAM  10/19/2018  . MAMMOGRAM  10/29/2019  . COLONOSCOPY  05/13/2024  . TETANUS/TDAP  07/16/2026  . INFLUENZA VACCINE  Completed  . DEXA SCAN  Completed  . Hepatitis C Screening  Completed  . PNA vac Low Risk Adult  Completed     Exam:  BP (!) 142/83   Pulse 93  Gen: Well NAD HEENT: EOMI,  MMM Lungs: Normal work of breathing. CTABL Heart: RRR no MRG Abd: NABS, Soft. Nondistended, Nontender Exts: Brisk capillary refill, warm and well perfused.   Psych alert and oriented normal speech thought process and affect.  GAD 7 : Generalized Anxiety Score 11/09/2017 10/19/2017  Nervous, Anxious, on Edge 0 3  Control/stop worrying 0 3  Worry too much - different things 0 3  Trouble relaxing 0 3  Restless 0 3  Easily annoyed or irritable 0 3  Afraid - awful might happen 0 3  Total GAD 7 Score 0 21  Anxiety Difficulty Not difficult at all Very difficult       Results for orders placed or performed in visit on 11/09/17 (from the past 72 hour(s))  POCT Glucose (CBG)     Status: Abnormal   Collection Time: 11/09/17  9:24 AM  Result Value Ref Range   POC Glucose 210 (A) 70 - 99 mg/dl   No results found.    Assessment and Plan: 71 y.o. female with  Generalized anxiety disorder in remission  with continue Zoloft for 6-12 months and recheck.  Reassess in 3 months.  Jitteriness: Unclear etiology.  I am not sure if this is related to her blood sugars or not.  She seems to be asymptomatic now with hyperglycemia.  Plan for watchful waiting with careful low blood sugar logging.  Recheck in 3 months or sooner if needed.   Orders Placed This Encounter  Procedures  . POCT Glucose (CBG)   No orders of the defined types were placed in this encounter.    Discussed warning signs or symptoms. Please see discharge instructions. Patient expresses understanding.

## 2018-02-09 ENCOUNTER — Ambulatory Visit (INDEPENDENT_AMBULATORY_CARE_PROVIDER_SITE_OTHER): Payer: Medicare HMO | Admitting: Family Medicine

## 2018-02-09 ENCOUNTER — Encounter: Payer: Self-pay | Admitting: Family Medicine

## 2018-02-09 VITALS — BP 141/88 | HR 89 | Ht 67.0 in | Wt 200.0 lb

## 2018-02-09 DIAGNOSIS — E1121 Type 2 diabetes mellitus with diabetic nephropathy: Secondary | ICD-10-CM

## 2018-02-09 DIAGNOSIS — R454 Irritability and anger: Secondary | ICD-10-CM

## 2018-02-09 DIAGNOSIS — E785 Hyperlipidemia, unspecified: Secondary | ICD-10-CM | POA: Diagnosis not present

## 2018-02-09 DIAGNOSIS — E11319 Type 2 diabetes mellitus with unspecified diabetic retinopathy without macular edema: Secondary | ICD-10-CM | POA: Diagnosis not present

## 2018-02-09 DIAGNOSIS — I10 Essential (primary) hypertension: Secondary | ICD-10-CM | POA: Diagnosis not present

## 2018-02-09 LAB — POCT GLYCOSYLATED HEMOGLOBIN (HGB A1C): HEMOGLOBIN A1C: 6.5

## 2018-02-09 MED ORDER — IRBESARTAN-HYDROCHLOROTHIAZIDE 300-12.5 MG PO TABS: 1.0000 | ORAL_TABLET | Freq: Every day | ORAL | 1 refills | Status: DC

## 2018-02-09 NOTE — Patient Instructions (Signed)
Thank you for coming in today. STOP losartan/HCTZ and start Ibersartan/HCTZ. Let me know if you cannot get the medicine at your mail order pharmacy.  We may change the prescription based on availability.  Reduce the dose of the Sertaline to 50mg  at night. This is a 1/2 of the 100mg  pill.  If this works well let me know and I will update the prescription in the computer.   If all is well recheck in 3 months.   Send a reminder about 1-2 weeks before the next visit and I will order fasting labs ahead of time so that the results are back during the visit.   Please  Follow up with a repeat eye exam this March.

## 2018-02-09 NOTE — Progress Notes (Signed)
Lisa EhrichSarah Phelps is a 72 y.o. female who [name] today for follow-up diabetes, hypertension, hyperlipidemia, anxiety/depression/irritability.  Diabetes: Lisa SagoSarah has done well with her diabetes over the last 3 months.  She continues to eat a careful diet and feels well with no episodes of hyper or hypoglycemia.  No polyuria or polydipsia.  Continues to take the metformin listed below.  Hypertension: Lisa SagoSarah continues to take losartan/hydrochlorothiazide for her elevated blood pressure.  She notes that her losartan hydrochlorothiazide was subject to recall and is not sure what she should do.  She tolerates it well with no chest pain palpitations or shortness of breath.  She denies any episodes of lightheadedness or dizziness.  Anxiety depression irritability.  Lisa SagoSarah has had difficulties with anxiety or irritability and depression in the past.  She was started on Zoloft and currently takes 100 mg daily.  She notes this has worked Doctor, general practicephenomenally well to improve her mood and help her deal with all of life's challenges.  She notes considerably less irritability which was her main bothersome symptom.  She does note however she feels pretty fatigued especially in the morning after she takes her Zoloft.  She takes 100 mg of Zoloft at that time.  She would like to be less fatigued with this medication but is doing to except that if it helps her feel as good as she is feeling now.  Cholesterol: Lisa SagoSarah continues to take atorvastatin daily.  She denies significant muscle aches or pains.   Past Medical History:  Diagnosis Date  . Diabetes (HCC)   . Hyperlipidemia   . Hypertension    Past Surgical History:  Procedure Laterality Date  . ABDOMINAL HYSTERECTOMY  1992  . gallbladder removed  1998   Social History   Tobacco Use  . Smoking status: Former Games developermoker  . Smokeless tobacco:  Never Used  Substance Use Topics  . Alcohol use: Yes   family history includes Diabetes in her brother and unknown relative; Hyperlipidemia in her unknown relative; Hypertension in her brother and unknown relative.  ROS as above:  Medications: Current Outpatient Medications  Medication Sig Dispense Refill  . AMBULATORY NON FORMULARY MEDICATION Bayer Contour TS Blood glucose testing strips, use once a day to test blood sugar.  Dx Type 2 diabetes 250.00 50 strip 11  . AMBULATORY NON FORMULARY MEDICATION Bayer Contour TS Blood glucose lancets, use once daily to test blood sugar.  Dx Type 2 diabetes 250.00 50 Units 11  . aspirin 81 MG tablet Take 81 mg by mouth daily.    Marland Kitchen. atorvastatin (LIPITOR) 10 MG tablet Take 1 tablet (10 mg total) by mouth daily. 90 tablet 3  . metFORMIN (GLUCOPHAGE) 500 MG tablet One by mouth every morning and one at dinner time. 180 tablet 2  . sertraline (ZOLOFT) 100 MG tablet Take 1 tablet (100 mg total) by mouth daily. 90 tablet 1  . irbesartan-hydrochlorothiazide (AVALIDE) 300-12.5 MG tablet Take 1 tablet by mouth daily. 90 tablet 1   No current facility-administered medications for this visit.    Allergies  Allergen Reactions  . Crestor [Rosuvastatin]     cough  . Nsaids   . Pravastatin     myalgias    Health Maintenance Health Maintenance  Topic Date Due  . OPHTHALMOLOGY EXAM  02/28/2018  . HEMOGLOBIN A1C  04/18/2018  . FOOT EXAM  10/19/2018  . MAMMOGRAM  10/29/2019  . COLONOSCOPY  05/13/2024  . TETANUS/TDAP  07/16/2026  . INFLUENZA VACCINE  Completed  . DEXA SCAN  Completed  . Hepatitis C Screening  Completed  . PNA vac Low Risk Adult  Completed     Exam:  BP (!) 141/88   Pulse 89   Ht 5\' 7"  (1.702 m)   Wt 200 lb (90.7 kg)   BMI 31.32 kg/m  Gen: Well NAD HEENT: EOMI,  MMM Lungs: Normal work of breathing. CTABL Heart: RRR no MRG Abd: NABS, Soft. Nondistended, Nontender Exts: Brisk capillary refill, warm and well perfused.  Psych:  Alert and oriented normal speech thought process and affect.  No SI or HI expressed.  Depression screen [facility] 2/9 02/09/2018 07/19/2017 07/16/2016 04/03/2014  Decreased Interest 1 1 0 0  Down, Depressed, Hopeless 1 1 - 0  PHQ - 2 Score 2 2 0 0  Altered sleeping 1 1 - -  Tired, decreased energy 1 1 - -  Change in appetite 0 0 - -  Feeling bad or failure about yourself  0 0 - -  Trouble concentrating 0 0 - -  Moving slowly or fidgety/restless 0 0 - -  Suicidal thoughts 0 0 - -  PHQ-9 Score 4 4 - -  Difficult doing work/chores Not difficult at all Somewhat difficult - -   GAD 7 : Generalized Anxiety Score 02/09/2018 11/09/2017 10/19/2017  Nervous, Anxious, on Edge 0 0 3  Control/stop worrying 0 0 3  Worry too much - different things 0 0 3  Trouble relaxing 0 0 3  Restless 0 0 3  Easily annoyed or irritable 0 0 3  Afraid - awful might happen 0 0 3  Total GAD 7 Score 0 0 21  Anxiety Difficulty Not difficult at all Not difficult at all Very difficult       Results for orders placed or performed in visit on 02/09/18 (from the past 72 hour(s))  POCT HgB A1C     Status: None   Collection Time: 02/09/18  8:46 AM  Result Value Ref Range   Hemoglobin A1C 6.5    No results found.    Assessment and Plan: 73 y.o. female with  Diabetes: Doing extremely well.  Continue current regimen.  Diabetic eye exam is due in March.  Referral to ophthalmology placed again given her history of diabetic retinopathy.  Recheck in 3 months.  Hypertension: Not at goal.  Losartan recalled.  Will switch to irbesartan hydrochlorothiazide.  If this is not available or preferred next option would be valsartan hydrochlorothiazide.  Recheck labs and blood pressure in 3 months.  Mood: Significantly improved.  We will try decreasing the Zoloft to 50 mg and see if this makes any improvement in her fatigue.  If all is well we will redo a prescription for 50 mg tablets.  If not we will consider switching to Prozac.    Hyperlipidemia: Doing well at last check.  Patient tolerate statins.  Plan on rechecking fasting labs before the next visit.     Orders Placed This Encounter  Procedures  . Ambulatory referral to Ophthalmology    Referral Priority:   Routine    Referral Type:   Consultation    Referral Reason:   Specialty Services Required    Requested Specialty:   Ophthalmology    Number of Visits Requested:   1  . POCT HgB A1C   Meds ordered this encounter  Medications  . irbesartan-hydrochlorothiazide (AVALIDE) 300-12.5 MG tablet    Sig: Take 1 tablet by mouth daily.  Dispense:  90 tablet    Refill:  1    Losartan/HCTZ not effective and recalled. Valsartan decreased access due to recall.     Discussed warning signs or symptoms. Please see discharge instructions. Patient expresses understanding.

## 2018-03-07 DIAGNOSIS — H25813 Combined forms of age-related cataract, bilateral: Secondary | ICD-10-CM | POA: Diagnosis not present

## 2018-03-07 DIAGNOSIS — H40003 Preglaucoma, unspecified, bilateral: Secondary | ICD-10-CM | POA: Diagnosis not present

## 2018-03-07 DIAGNOSIS — H527 Unspecified disorder of refraction: Secondary | ICD-10-CM | POA: Diagnosis not present

## 2018-03-07 DIAGNOSIS — H1045 Other chronic allergic conjunctivitis: Secondary | ICD-10-CM | POA: Diagnosis not present

## 2018-03-07 DIAGNOSIS — E113293 Type 2 diabetes mellitus with mild nonproliferative diabetic retinopathy without macular edema, bilateral: Secondary | ICD-10-CM | POA: Diagnosis not present

## 2018-03-07 LAB — HM DIABETES EYE EXAM

## 2018-03-09 ENCOUNTER — Encounter: Payer: Self-pay | Admitting: Family Medicine

## 2018-04-18 ENCOUNTER — Other Ambulatory Visit: Payer: Self-pay | Admitting: Family Medicine

## 2018-05-01 ENCOUNTER — Other Ambulatory Visit: Payer: Self-pay | Admitting: Family Medicine

## 2018-05-02 MED ORDER — SERTRALINE HCL 100 MG PO TABS: 100.0000 mg | ORAL_TABLET | Freq: Every day | ORAL | 0 refills | Status: DC

## 2018-05-09 ENCOUNTER — Ambulatory Visit (INDEPENDENT_AMBULATORY_CARE_PROVIDER_SITE_OTHER): Payer: Medicare HMO | Admitting: Family Medicine

## 2018-05-09 ENCOUNTER — Encounter: Payer: Self-pay | Admitting: Family Medicine

## 2018-05-09 VITALS — BP 164/96 | HR 91 | Ht 67.0 in | Wt 201.0 lb

## 2018-05-09 DIAGNOSIS — E11319 Type 2 diabetes mellitus with unspecified diabetic retinopathy without macular edema: Secondary | ICD-10-CM | POA: Diagnosis not present

## 2018-05-09 DIAGNOSIS — E559 Vitamin D deficiency, unspecified: Secondary | ICD-10-CM | POA: Diagnosis not present

## 2018-05-09 DIAGNOSIS — E1121 Type 2 diabetes mellitus with diabetic nephropathy: Secondary | ICD-10-CM

## 2018-05-09 DIAGNOSIS — E1159 Type 2 diabetes mellitus with other circulatory complications: Secondary | ICD-10-CM | POA: Diagnosis not present

## 2018-05-09 DIAGNOSIS — I10 Essential (primary) hypertension: Secondary | ICD-10-CM | POA: Diagnosis not present

## 2018-05-09 DIAGNOSIS — R454 Irritability and anger: Secondary | ICD-10-CM | POA: Diagnosis not present

## 2018-05-09 DIAGNOSIS — E785 Hyperlipidemia, unspecified: Secondary | ICD-10-CM | POA: Diagnosis not present

## 2018-05-09 DIAGNOSIS — I152 Hypertension secondary to endocrine disorders: Secondary | ICD-10-CM

## 2018-05-09 LAB — POCT GLYCOSYLATED HEMOGLOBIN (HGB A1C): Hemoglobin A1C: 7.4 % — AB (ref 4.0–5.6)

## 2018-05-09 MED ORDER — SERTRALINE HCL 50 MG PO TABS: 50.0000 mg | ORAL_TABLET | Freq: Every day | ORAL | 1 refills | Status: DC

## 2018-05-09 MED ORDER — AMBULATORY NON FORMULARY MEDICATION: 11 refills | Status: DC

## 2018-05-09 NOTE — Progress Notes (Signed)
Lisa Phelps is a 72 y.o. female who presents to [facility]: Primary Care Sports Medicine today for follow-up diabetes, anxiety/irritability, hypertension.  Diabetes: Lisa Phelps notes that she continues to take the metformin listed below.  She checks her blood sugar intermittently and notes it is typically normal range.  She notes that her low-carb diet has slacked off a bit over the last several months as she is having more stress at home with her sick husband.  She denies any episodes of hyperglycemia or hypoglycemia polyuria or polydipsia.  Anxiety/irritability: Significantly improved on Zoloft 50 mg daily.  She previously was cutting her 100 mg dose in half.  She notes that she is feeling much better and less irritable.  Hypertension: Currently managed with irbesartan hydrochlorothiazide.  She notes when she measures her blood pressure at home is typically well controlled in the 130s over 60s.  She did not take her blood pressure medicine today because she is fasting.   ROS as above:  Exam:  BP (!) 164/96   Pulse 91   Ht  (1.702 m)   Wt 201 lb (91.2 kg)   BMI 31.48 kg/m  Gen: Well NAD HEENT: EOMI,  MMM Lungs: Normal work of breathing. CTABL Heart: RRR no MRG Abd: NABS, Soft. Nondistended, Nontender Exts: Brisk capillary refill, warm and well perfused.  Psych alert and oriented normal speech thought process and affect.  Lab and Radiology Results Results for orders placed or performed in visit on 05/09/18 (from the past 72 hour(s))  POCT HgB A1C     Status: Abnormal   Collection Time: 05/09/18  8:27 AM  Result Value Ref Range   Hemoglobin A1C 7.4 (A) 4.0 - 5.6 %   HbA1c, POC (prediabetic range)  5.7 - 6.4 %   HbA1c, POC (controlled diabetic range)  0.0 - 7.0 %   No results found.   Assessment and Plan: 72 y.o. female with  Diabetes: Slightly increased A1c from baseline.  Recommend  re-adherence to lower carbohydrate diet.  Continue current regimen recheck in 3 months if not doing well at that point crease metformin or add other medications.  Hypertension: Elevated today but typically pretty well controlled.  Continue current regimen and recheck in 3 months.  Irritability/anxiety: Doing well continue current sertraline.  Check basic fasting labs listed below pretension hyperlipidemia diabetes vitamin D deficiency   Orders Placed This Encounter  Procedures  . CBC  . COMPLETE METABOLIC PANEL WITH GFR  . Lipid Panel w/reflex Direct LDL  . VITAMIN D 25 Hydroxy (Vit-D Deficiency, Fractures)  . POCT HgB A1C   Meds ordered this encounter  Medications  . AMBULATORY NON FORMULARY MEDICATION    Sig: Bayer Contour TS Blood glucose testing strips, use once a day to test blood sugar.  Dx Type 2 diabetes 250.00    Dispense:  90 strip    Refill:  11  . AMBULATORY NON FORMULARY MEDICATION    Sig: Bayer Contour TS Blood glucose lancets, use once daily to test blood sugar.  Dx Type 2 diabetes 250.00    Dispense:  90 Units    Refill:  11  . sertraline (ZOLOFT) 50 MG tablet    Sig: Take 1 tablet (50 mg total) by mouth daily.    Dispense:  90 tablet    Refill:  1     Historical information moved to improve visibility of documentation.  Past Medical History:  Diagnosis Date  . Diabetes (HCC)   .  Hyperlipidemia   . Hypertension    Past Surgical History:  Procedure Laterality Date  . ABDOMINAL HYSTERECTOMY  1992  . gallbladder removed  1998   Social History   Tobacco Use  . Smoking status: Former Games developer  . Smokeless tobacco: Never Used  Substance Use Topics  . Alcohol use: Yes   family history includes Diabetes in her brother and unknown relative; Hyperlipidemia in her unknown relative; Hypertension in her brother and unknown relative.  Medications: Current Outpatient Medications  Medication Sig Dispense Refill  . AMBULATORY NON FORMULARY MEDICATION Bayer  Contour TS Blood glucose testing strips, use once a day to test blood sugar.  Dx Type 2 diabetes 250.00 90 strip 11  . AMBULATORY NON FORMULARY MEDICATION Bayer Contour TS Blood glucose lancets, use once daily to test blood sugar.  Dx Type 2 diabetes 250.00 90 Units 11  . aspirin 81 MG tablet Take 81 mg by mouth daily.    Marland Kitchen atorvastatin (LIPITOR) 10 MG tablet TAKE 1 TABLET EVERY DAY 90 tablet 3  . irbesartan-hydrochlorothiazide (AVALIDE) 300-12.5 MG tablet Take 1 tablet by mouth daily. 90 tablet 1  . metFORMIN (GLUCOPHAGE) 500 MG tablet TAKE 1 TABLET EVERY MORNING  AND TAKE 1 TABLET  AT  DINNER  TIME 180 tablet 2  . sertraline (ZOLOFT) 50 MG tablet Take 1 tablet (50 mg total) by mouth daily. 90 tablet 1   No current facility-administered medications for this visit.    Allergies  Allergen Reactions  . Crestor [Rosuvastatin]     cough  . Nsaids   . Pravastatin     myalgias    Health Maintenance Health Maintenance  Topic Date Due  . INFLUENZA VACCINE  07/13/2018  . HEMOGLOBIN A1C  08/09/2018  . FOOT EXAM  10/19/2018  . OPHTHALMOLOGY EXAM  03/08/2019  . MAMMOGRAM  10/29/2019  . COLONOSCOPY  05/13/2024  . TETANUS/TDAP  07/16/2026  . DEXA SCAN  Completed  . Hepatitis C Screening  Completed  . PNA vac Low Risk Adult  Completed    Discussed warning signs or symptoms. Please see discharge instructions. Patient expresses understanding.

## 2018-05-09 NOTE — Patient Instructions (Addendum)
Thank you for coming in today. Get labs today.  Recheck in 3 months.  Return sooner if needed.   Try to reduce carbs to 60g of fewer per meal.

## 2018-05-10 LAB — COMPLETE METABOLIC PANEL WITH GFR
AG Ratio: 1.6 (calc) (ref 1.0–2.5)
ALKALINE PHOSPHATASE (APISO): 78 U/L (ref 33–130)
ALT: 18 U/L (ref 6–29)
AST: 30 U/L (ref 10–35)
Albumin: 4.5 g/dL (ref 3.6–5.1)
BUN: 12 mg/dL (ref 7–25)
CO2: 27 mmol/L (ref 20–32)
Calcium: 10.4 mg/dL (ref 8.6–10.4)
Chloride: 101 mmol/L (ref 98–110)
Creat: 0.91 mg/dL (ref 0.60–0.93)
GFR, Est African American: 74 mL/min/{1.73_m2} (ref >=60)
GFR, Est Non African American: 63 mL/min/{1.73_m2} (ref >=60)
GLOBULIN: 2.9 g/dL (ref 1.9–3.7)
Glucose, Bld: 134 mg/dL — ABNORMAL HIGH (ref 65–99)
POTASSIUM: 3.8 mmol/L (ref 3.5–5.3)
SODIUM: 139 mmol/L (ref 135–146)
Total Bilirubin: 0.4 mg/dL (ref 0.2–1.2)
Total Protein: 7.4 g/dL (ref 6.1–8.1)

## 2018-05-10 LAB — LIPID PANEL W/REFLEX DIRECT LDL
CHOLESTEROL: 173 mg/dL (ref <200)
HDL: 50 mg/dL — AB (ref >50)
LDL Cholesterol (Calc): 103 mg/dL (calc) — ABNORMAL HIGH
NON-HDL CHOLESTEROL (CALC): 123 mg/dL (ref <130)
Total CHOL/HDL Ratio: 3.5 (calc) (ref <5.0)
Triglycerides: 102 mg/dL (ref <150)

## 2018-05-10 LAB — CBC
HEMATOCRIT: 43.4 % (ref 35.0–45.0)
Hemoglobin: 14.5 g/dL (ref 11.7–15.5)
MCH: 30.7 pg (ref 27.0–33.0)
MCHC: 33.4 g/dL (ref 32.0–36.0)
MCV: 91.8 fL (ref 80.0–100.0)
MPV: 9.6 fL (ref 7.5–12.5)
Platelets: 303 10*3/uL (ref 140–400)
RBC: 4.73 10*6/uL (ref 3.80–5.10)
RDW: 13.6 % (ref 11.0–15.0)
WBC: 6.6 10*3/uL (ref 3.8–10.8)

## 2018-05-10 LAB — VITAMIN D 25 HYDROXY (VIT D DEFICIENCY, FRACTURES): VIT D 25 HYDROXY: 40 ng/mL (ref 30–100)

## 2018-05-12 ENCOUNTER — Telehealth: Payer: Self-pay | Admitting: Family Medicine

## 2018-05-12 DIAGNOSIS — E1121 Type 2 diabetes mellitus with diabetic nephropathy: Secondary | ICD-10-CM

## 2018-05-12 NOTE — Telephone Encounter (Signed)
When starting the PA for the Contour Test Strips the insurance I got a message that insurance  prefers Freestyle test strips. It does not give me an option to appeal. Please advise.

## 2018-05-12 NOTE — Telephone Encounter (Signed)
Dr. Denyse Amass spoke to patient and advised her what her insurance preferred. So she will need a new script for Freestyle meter, freestyle test strips and the free style lancets. Please advise. Rhonda Cunningham,CMA

## 2018-05-15 MED ORDER — AMBULATORY NON FORMULARY MEDICATION: 11 refills | Status: DC

## 2018-05-15 NOTE — Telephone Encounter (Signed)
New test strips and meter prescription printed.  Available for pickup or we can fax [facility].

## 2018-05-15 NOTE — Addendum Note (Signed)
Addended by: Rodolph BongOREY, EVAN S on: 05/15/2018 07:10 AM   Modules accepted: Orders

## 2018-05-16 NOTE — Telephone Encounter (Signed)
rx has been faxed. Keene Gilkey,CMA

## 2018-05-29 MED ORDER — AMBULATORY NON FORMULARY MEDICATION: 11 refills | Status: DC

## 2018-05-29 NOTE — Telephone Encounter (Signed)
Received a letter from [facility] that insurance will not cover any meter for this patient. Does she need to pay out of pocket? Please advise.

## 2018-05-29 NOTE — Telephone Encounter (Signed)
So after further research it seems that the patient will have to get this meter at local pharmacy. Please send to local pharmacy on file.

## 2018-05-29 NOTE — Addendum Note (Signed)
Addended by: Rodolph BongOREY, Purvis Sidle S on: 05/29/2018 04:29 PM   Modules accepted: Orders

## 2018-05-29 NOTE — Telephone Encounter (Signed)
Will send to local pharmacy

## 2018-05-30 NOTE — Telephone Encounter (Signed)
Rx for meter faxed to [facility] and patient has been informed. Rhonda Cunningham,CMA

## 2018-08-01 ENCOUNTER — Other Ambulatory Visit: Payer: Self-pay | Admitting: Family Medicine

## 2018-08-09 ENCOUNTER — Ambulatory Visit (INDEPENDENT_AMBULATORY_CARE_PROVIDER_SITE_OTHER): Payer: Medicare HMO | Admitting: Family Medicine

## 2018-08-09 ENCOUNTER — Encounter: Payer: Self-pay | Admitting: Family Medicine

## 2018-08-09 VITALS — BP 132/78 | HR 87 | Ht 67.0 in | Wt 200.0 lb

## 2018-08-09 DIAGNOSIS — R454 Irritability and anger: Secondary | ICD-10-CM | POA: Diagnosis not present

## 2018-08-09 DIAGNOSIS — R053 Chronic cough: Secondary | ICD-10-CM | POA: Insufficient documentation

## 2018-08-09 DIAGNOSIS — E11319 Type 2 diabetes mellitus with unspecified diabetic retinopathy without macular edema: Secondary | ICD-10-CM

## 2018-08-09 DIAGNOSIS — E1159 Type 2 diabetes mellitus with other circulatory complications: Secondary | ICD-10-CM | POA: Diagnosis not present

## 2018-08-09 DIAGNOSIS — R05 Cough: Secondary | ICD-10-CM | POA: Diagnosis not present

## 2018-08-09 DIAGNOSIS — Z23 Encounter for immunization: Secondary | ICD-10-CM | POA: Diagnosis not present

## 2018-08-09 DIAGNOSIS — I10 Essential (primary) hypertension: Secondary | ICD-10-CM | POA: Diagnosis not present

## 2018-08-09 DIAGNOSIS — E1121 Type 2 diabetes mellitus with diabetic nephropathy: Secondary | ICD-10-CM

## 2018-08-09 DIAGNOSIS — E785 Hyperlipidemia, unspecified: Secondary | ICD-10-CM

## 2018-08-09 LAB — POCT GLYCOSYLATED HEMOGLOBIN (HGB A1C)

## 2018-08-09 MED ORDER — BENZONATATE 200 MG PO CAPS: 200.0000 mg | ORAL_CAPSULE | Freq: Three times a day (TID) | ORAL | 3 refills | Status: DC | PRN

## 2018-08-09 MED ORDER — ATORVASTATIN CALCIUM 20 MG PO TABS: 20.0000 mg | ORAL_TABLET | Freq: Every day | ORAL | 1 refills | Status: DC

## 2018-08-09 MED ORDER — SERTRALINE HCL 50 MG PO TABS: 50.0000 mg | ORAL_TABLET | Freq: Every day | ORAL | 1 refills | Status: DC

## 2018-08-09 MED ORDER — ALBUTEROL SULFATE HFA 108 (90 BASE) MCG/ACT IN AERS: 2.0000 | INHALATION_SPRAY | Freq: Four times a day (QID) | RESPIRATORY_TRACT | 3 refills | Status: AC | PRN

## 2018-08-09 MED ORDER — IRBESARTAN-HYDROCHLOROTHIAZIDE 300-12.5 MG PO TABS: 1.0000 | ORAL_TABLET | Freq: Every day | ORAL | 3 refills | Status: DC

## 2018-08-09 NOTE — Progress Notes (Signed)
Lisa Phelps is a 72 y.o. female who presents to [facility]: Primary Care Sports Medicine today for follow-up diabetes, hypertension, hyperlipidemia.  Diabetes: Patient was seen 3 months ago at that time her A1c had increased a bit from 6.5 in February to 4 in March.  Her medications were continued however she was advised to reduced her carbohydrate intake.  In the interim she has cut back on her carbohydrates to 60 g per meal.  She notes that she is doing quite well and feels well otherwise.  Hypertension: Patient is taking the medication listed below.  No chest pain palpitations or shortness of breath.  Hyperlipidemia: Lisa Phelps takes 10 mg of atorvastatin daily.  She has had statin myalgias with higher doses of other statins.  She tolerates the medication well.  She denies significant muscle aches or pains.  Her LDL was recently checked and found to be just over 100.  Anxiety:Lisa Phelps has a history of anxiety and irritability that has been well controlled with Zoloft.  Her dose was decreased to 50 mg at the last visit.  She tolerates it well and notes that her mood is stable.   Chronic cough:  Lisa Phelps notes a chronic bothersome cough.  She does this is been ongoing for a year and a half.  The cough is mild and typically present in the morning.  She denies acid reflux or significant postnasal drainage.  She does note occasional wheezing.  She notes that she has a 40-pack-year history of smoking and quit about 10 years ago.  ROS as above:  Exam:  BP 132/78   Pulse 87   Ht 5\' 7"  (1.702 m)   Wt 200 lb (90.7 kg)   BMI 31.32 kg/m  Wt Readings from Last 5 Encounters:  08/09/18 200 lb (90.7 kg)  05/09/18 201 lb (91.2 kg)  02/09/18 200 lb (90.7 kg)  10/19/17 204 lb (92.5 kg)  07/19/17 201 lb (91.2 kg)    Gen: Well NAD HEENT: EOMI,  MMM normal posterior pharynx.  No significant nasal drainage.  Tympanic  membranes bilaterally.  No significant cervical lymphadenopathy. Lungs: Normal work of breathing. CTABL Heart: RRR no MRG Abd: NABS, Soft. Nondistended, Nontender Exts: Brisk capillary refill, warm and well perfused.   Lab and Radiology Results Results for orders placed or performed in visit on 08/09/18 (from the past 72 hour(s))  POCT HgB A1C     Status: None   Collection Time: 08/09/18  8:40 AM  Result Value Ref Range   Hemoglobin A1C     HbA1c POC (<> result, manual entry)     HbA1c, POC (prediabetic range)     HbA1c, POC (controlled diabetic range)        Assessment and Plan: 72 y.o. female with diabetes.  Well controlled with metformin and diet.  Continue current regimen and recheck in 3 months.  Hypertension: Blood pressure reasonably well-controlled continue current regimen.  Hyperlipidemia: LDL above goal.  Goal LDL is 70 or less.  Trial of higher dose of atorvastatin at 70 mg daily.  If not tolerating will back down to 10 mg daily.  Mood: Well-controlled on Zoloft 50.  New prescription sent.  Chronic cough: Likely multifactorial.  Suspect cough variant asthma is the most likely explanation.  Plan for trial of albuterol.  Tessalon Perles have worked well in the past as well will prescribe that as well.  Recheck in 3 months.  Flu vaccine given today prior to discharge. Orders  Placed This Encounter  Procedures  . Flu Vaccine QUAD 36+ mos IM    cunningham  . POCT HgB A1C   Meds ordered this encounter  Medications  . albuterol (PROVENTIL HFA;VENTOLIN HFA) 108 (90 Base) MCG/ACT inhaler    Sig: Inhale 2 puffs into the lungs every 6 (six) hours as needed for wheezing or shortness of breath.    Dispense:  1 Inhaler    Refill:  3  . benzonatate (TESSALON) 200 MG capsule    Sig: Take 1 capsule (200 mg total) by mouth 3 (three) times daily as needed for cough.    Dispense:  90 capsule    Refill:  3  . irbesartan-hydrochlorothiazide (AVALIDE) 300-12.5 MG tablet    Sig:  Take 1 tablet by mouth daily.    Dispense:  90 tablet    Refill:  3    Losartan/HCTZ not effective and recalled. Valsartan decreased access due to recall.  Marland Kitchen atorvastatin (LIPITOR) 20 MG tablet    Sig: Take 1 tablet (20 mg total) by mouth daily.    Dispense:  90 tablet    Refill:  1  . sertraline (ZOLOFT) 50 MG tablet    Sig: Take 1 tablet (50 mg total) by mouth daily.    Dispense:  90 tablet    Refill:  1    Replaces 100mg  dose sent on 08/02/18    Flu vaccine given Historical information moved to improve visibility of documentation.  Past Medical History:  Diagnosis Date  . Diabetes (HCC)   . Hyperlipidemia   . Hypertension    Past Surgical History:  Procedure Laterality Date  . ABDOMINAL HYSTERECTOMY  1992  . gallbladder removed  1998   Social History   Tobacco Use  . Smoking status: Former Smoker    Packs/day: 1.00    Years: 40.00    Pack years: 40.00    Last attempt to quit: 03/24/2008    Years since quitting: 10.3  . Smokeless tobacco: Never Used  Substance Use Topics  . Alcohol use: Yes   family history includes Diabetes in her brother and unknown relative; Hyperlipidemia in her unknown relative; Hypertension in her brother and unknown relative.  Medications: Current Outpatient Medications  Medication Sig Dispense Refill  . AMBULATORY NON FORMULARY MEDICATION Glucometer of choice with test strips lancets and control solution sufficient for testing daily.. Dispense quantity sufficient for 90 days dx Type 2 diabetes 250.00 90 strip 11  . aspirin 81 MG tablet Take 81 mg by mouth daily.    Marland Kitchen atorvastatin (LIPITOR) 20 MG tablet Take 1 tablet (20 mg total) by mouth daily. 90 tablet 1  . irbesartan-hydrochlorothiazide (AVALIDE) 300-12.5 MG tablet Take 1 tablet by mouth daily. 90 tablet 3  . metFORMIN (GLUCOPHAGE) 500 MG tablet TAKE 1 TABLET EVERY MORNING  AND TAKE 1 TABLET  AT  DINNER  TIME 180 tablet 2  . sertraline (ZOLOFT) 50 MG tablet Take 1 tablet (50 mg total) by  mouth daily. 90 tablet 1  . albuterol (PROVENTIL HFA;VENTOLIN HFA) 108 (90 Base) MCG/ACT inhaler Inhale 2 puffs into the lungs every 6 (six) hours as needed for wheezing or shortness of breath. 1 Inhaler 3  . benzonatate (TESSALON) 200 MG capsule Take 1 capsule (200 mg total) by mouth 3 (three) times daily as needed for cough. 90 capsule 3   No current facility-administered medications for this visit.    Allergies  Allergen Reactions  . Crestor [Rosuvastatin]     cough  . Nsaids   .  Pravastatin     myalgias     Discussed warning signs or symptoms. Please see discharge instructions. Patient expresses understanding.

## 2018-08-09 NOTE — Patient Instructions (Signed)
Thank you for coming in today.  Continue 50mg  dose of zoloft.  Increase Atrovistatin to 20mg  daily.  Continue diabetes medicine and less than 60g of carbs per meal.   For cough try using the albuterol inhaler.  Use the tessalon pearles as needed.   Recheck in 3 months.  Return sooner if needed.

## 2018-09-05 DIAGNOSIS — H40003 Preglaucoma, unspecified, bilateral: Secondary | ICD-10-CM | POA: Diagnosis not present

## 2018-09-05 LAB — HM DIABETES EYE EXAM

## 2018-09-07 ENCOUNTER — Telehealth: Payer: Self-pay | Admitting: Family Medicine

## 2018-09-07 NOTE — Telephone Encounter (Signed)
Received notes from ophthalmology regarding diabetic eye exam. Open angle glaucoma suspect.   No diabetic retinopathy Will be abstracted

## 2018-09-15 ENCOUNTER — Encounter: Payer: Self-pay | Admitting: Family Medicine

## 2018-09-18 ENCOUNTER — Other Ambulatory Visit: Payer: Self-pay | Admitting: Family Medicine

## 2018-09-18 DIAGNOSIS — Z1231 Encounter for screening mammogram for malignant neoplasm of breast: Secondary | ICD-10-CM

## 2018-11-01 ENCOUNTER — Ambulatory Visit (INDEPENDENT_AMBULATORY_CARE_PROVIDER_SITE_OTHER): Payer: Medicare HMO

## 2018-11-01 DIAGNOSIS — Z1231 Encounter for screening mammogram for malignant neoplasm of breast: Secondary | ICD-10-CM

## 2018-11-08 ENCOUNTER — Encounter: Payer: Self-pay | Admitting: Family Medicine

## 2018-11-08 ENCOUNTER — Ambulatory Visit (INDEPENDENT_AMBULATORY_CARE_PROVIDER_SITE_OTHER): Payer: Medicare HMO | Admitting: Family Medicine

## 2018-11-08 VITALS — BP 128/72 | HR 81 | Ht 67.0 in | Wt 198.0 lb

## 2018-11-08 DIAGNOSIS — E1159 Type 2 diabetes mellitus with other circulatory complications: Secondary | ICD-10-CM | POA: Diagnosis not present

## 2018-11-08 DIAGNOSIS — R454 Irritability and anger: Secondary | ICD-10-CM

## 2018-11-08 DIAGNOSIS — I10 Essential (primary) hypertension: Secondary | ICD-10-CM

## 2018-11-08 DIAGNOSIS — E11319 Type 2 diabetes mellitus with unspecified diabetic retinopathy without macular edema: Secondary | ICD-10-CM

## 2018-11-08 DIAGNOSIS — E785 Hyperlipidemia, unspecified: Secondary | ICD-10-CM | POA: Diagnosis not present

## 2018-11-08 DIAGNOSIS — E1121 Type 2 diabetes mellitus with diabetic nephropathy: Secondary | ICD-10-CM | POA: Diagnosis not present

## 2018-11-08 LAB — POCT GLYCOSYLATED HEMOGLOBIN (HGB A1C): HEMOGLOBIN A1C: 6.9 % — AB (ref 4.0–5.6)

## 2018-11-08 NOTE — Patient Instructions (Signed)
Thank you for coming in today. Continue current treatment.  Recheck in 5 months or sooner if needed.   If we can remember we should check fasting labs about 1 week prior to the visit in 5 months.

## 2018-11-08 NOTE — Progress Notes (Signed)
Martyn EhrichSarah Hallquist is a 72 y.o. female who [name] today for follow-up diabetes hypertension hyperlipidemia and mood.  [name] notes her diabetes is quite well controlled taking medications listed below.  No hyper or hypoglycemic episodes.  Hypertension: Well-controlled with medicines below.  No chest pain palpitations lightheadedness or shortness of breath.  Lipids: Well controlled with atorvastatin.  No significant muscle aches or pains.  Mood: Well controlled with sertraline.  Patient notes that it helps manage her irritability.  She tolerates it well.   ROS as above:  Exam:  BP 128/72   Pulse 81   Ht 5\' 7"  (1.702 m)   Wt 198 lb (89.8 kg)   BMI 31.01 kg/m  Wt Readings from Last 5 Encounters:  11/08/18 198 lb (89.8 kg)  08/09/18 200 lb (90.7 kg)  05/09/18 201 lb (91.2 kg)  02/09/18 200 lb (90.7 kg)  10/19/17 204 lb (92.5 kg)    Gen: Well NAD HEENT: EOMI,  MMM Lungs: Normal work of breathing. CTABL Heart: RRR no MRG Abd: NABS, Soft. Nondistended, Nontender Exts: Brisk capillary refill, warm and well perfused.  Psych: Alert and oriented normal speech thought process and affect.  Depression screen [facility] 2/[facility] 11/08/2018 02/09/2018 07/19/2017 07/16/2016 04/03/2014  Decreased Interest 1 1 1  0 0  Down, Depressed, Hopeless 0 1 1 - 0  PHQ - 2 Score 1 2 2  0 0  Altered sleeping 1 1 1  - -  Tired, decreased energy 0 1 1 - -  Change in appetite 0 0 0 - -  Feeling bad or failure about yourself  0 0 0 - -  Trouble concentrating 0 0 0 - -  Moving slowly or fidgety/restless 0 0 0 - -  Suicidal thoughts 0 0 0 - -  PHQ-9 Score 2 4 4  - -  Difficult doing work/chores Not difficult at all Not difficult at all Somewhat difficult - -     Lab and Radiology Results Results for orders placed or performed in visit on 11/08/18 (from the past 72 hour(s))  POCT HgB A1C      Status: Abnormal   Collection Time: 11/08/18  8:28 AM  Result Value Ref Range   Hemoglobin A1C 6.9 (A) 4.0 - 5.6 %   HbA1c POC (<> result, manual entry)     HbA1c, POC (prediabetic range)     HbA1c, POC (controlled diabetic range)     No results found.    Assessment and Plan: 72 y.o. female with  Diabetes well-controlled continue current regimen recheck in about 5 months.  Get basic fasting labs prior to visit.  Hypertension also controlled recheck 5 months get labs at a time.  Hyperlipidemia as above  Mood: Well-controlled continue current regimen.   Orders Placed This Encounter  Procedures  . POCT HgB A1C   No orders of the defined types were placed in this encounter.    Historical information moved to improve visibility of documentation.  Past Medical History:  Diagnosis Date  . Diabetes (HCC)   . Hyperlipidemia   . Hypertension    Past Surgical History:  Procedure Laterality Date  . ABDOMINAL HYSTERECTOMY  1992  . gallbladder removed  1998   Social History   Tobacco Use  . Smoking status: Former Smoker    Packs/day: 1.00    Years: 40.00    Pack years: 40.00    Last attempt to quit: 03/24/2008    Years since  quitting: 10.6  . Smokeless tobacco: Never Used  Substance Use Topics  . Alcohol use: Yes   family history includes Diabetes in her brother and unknown relative; Hyperlipidemia in her unknown relative; Hypertension in her brother and unknown relative.  Medications: Current Outpatient Medications  Medication Sig Dispense Refill  . albuterol (PROVENTIL HFA;VENTOLIN HFA) 108 (90 Base) MCG/ACT inhaler Inhale 2 puffs into the lungs every 6 (six) hours as needed for wheezing or shortness of breath. 1 Inhaler 3  . AMBULATORY NON FORMULARY MEDICATION Glucometer of choice with test strips lancets and control solution sufficient for testing daily.. Dispense quantity sufficient for 90 days dx Type 2 diabetes 250.00 90 strip 11  . aspirin 81 MG tablet Take  81 mg by mouth daily.    Marland Kitchen atorvastatin (LIPITOR) 20 MG tablet Take 1 tablet (20 mg total) by mouth daily. 90 tablet 1  . benzonatate (TESSALON) 200 MG capsule Take 1 capsule (200 mg total) by mouth 3 (three) times daily as needed for cough. 90 capsule 3  . irbesartan-hydrochlorothiazide (AVALIDE) 300-12.5 MG tablet Take 1 tablet by mouth daily. 90 tablet 3  . metFORMIN (GLUCOPHAGE) 500 MG tablet TAKE 1 TABLET EVERY MORNING  AND TAKE 1 TABLET  AT  DINNER  TIME 180 tablet 2  . sertraline (ZOLOFT) 50 MG tablet Take 1 tablet (50 mg total) by mouth daily. 90 tablet 1   No current facility-administered medications for this visit.    Allergies  Allergen Reactions  . Crestor [Rosuvastatin]     cough  . Nsaids   . Pravastatin     myalgias     Discussed warning signs or symptoms. Please see discharge instructions. Patient expresses understanding.

## 2019-01-15 ENCOUNTER — Other Ambulatory Visit: Payer: Self-pay | Admitting: Family Medicine

## 2019-03-12 ENCOUNTER — Other Ambulatory Visit: Payer: Self-pay | Admitting: Family Medicine

## 2019-04-09 ENCOUNTER — Ambulatory Visit: Payer: Medicare HMO | Admitting: Family Medicine

## 2019-05-02 ENCOUNTER — Ambulatory Visit: Payer: Medicare HMO | Admitting: Family Medicine

## 2019-05-04 ENCOUNTER — Encounter: Payer: Self-pay | Admitting: Family Medicine

## 2019-05-04 ENCOUNTER — Ambulatory Visit (INDEPENDENT_AMBULATORY_CARE_PROVIDER_SITE_OTHER): Payer: Medicare HMO | Admitting: Family Medicine

## 2019-05-04 VITALS — BP 145/85 | HR 93 | Ht 67.0 in | Wt 200.0 lb

## 2019-05-04 DIAGNOSIS — R454 Irritability and anger: Secondary | ICD-10-CM

## 2019-05-04 DIAGNOSIS — R05 Cough: Secondary | ICD-10-CM

## 2019-05-04 DIAGNOSIS — I10 Essential (primary) hypertension: Secondary | ICD-10-CM | POA: Diagnosis not present

## 2019-05-04 DIAGNOSIS — E785 Hyperlipidemia, unspecified: Secondary | ICD-10-CM | POA: Diagnosis not present

## 2019-05-04 DIAGNOSIS — E1121 Type 2 diabetes mellitus with diabetic nephropathy: Secondary | ICD-10-CM | POA: Diagnosis not present

## 2019-05-04 DIAGNOSIS — R053 Chronic cough: Secondary | ICD-10-CM

## 2019-05-04 DIAGNOSIS — E1159 Type 2 diabetes mellitus with other circulatory complications: Secondary | ICD-10-CM

## 2019-05-04 DIAGNOSIS — E11319 Type 2 diabetes mellitus with unspecified diabetic retinopathy without macular edema: Secondary | ICD-10-CM

## 2019-05-04 LAB — POCT GLYCOSYLATED HEMOGLOBIN (HGB A1C): Hemoglobin A1C: 6.9 % — AB (ref 4.0–5.6)

## 2019-05-04 MED ORDER — BENZONATATE 200 MG PO CAPS: 200.0000 mg | ORAL_CAPSULE | Freq: Three times a day (TID) | ORAL | 3 refills | Status: DC | PRN

## 2019-05-04 NOTE — Patient Instructions (Addendum)
Thank you for coming in today. Recheck in 6 months for physical and diabetes follow up.  Return sooner if needed.  Continue medicine.  Keep track of blood pressure at home.   Call or mail the blood pressure results over the next month.  Work on diet also. I think your blood pressure and diabetes had worsened a bit because of the eating.   Get labs now.

## 2019-05-04 NOTE — Progress Notes (Signed)
Lisa EhrichSarah Campi is a 73 y.o. female [name] today for hypertension, diabetes, mood.  Diabetes: Managed with metformin and diet and exercise.  Patient notes that she has been eating a bit more recently.  She does not notice much change in her blood sugar.  She feels well.  Hypertension: Managed with Avalide.  She does have a blood pressure cuff but has not been measuring her blood pressure recently.  No chest pain palpitation shortness of breath.  She notes that she is been eating a little bit more recently.  Hyperlipidemia: Managed with atorvastatin.  No muscle pains or aches.  Doing well.  Mood: Previous irritability managed with Zoloft 50 mg daily.  With how things are going.  Cough: Uses albuterol and tessalon very infrequently.  She notes this helps quite a bit and is happy with how things are going. ROS as above:  Exam:  BP (!) 145/85   Pulse 93   Ht 5\' 7"  (1.702 m)   Wt 200 lb (90.7 kg)   BMI 31.32 kg/m  Wt Readings from Last 5 Encounters:  05/04/19 200 lb (90.7 kg)  11/08/18 198 lb (89.8 kg)  08/09/18 200 lb (90.7 kg)  05/09/18 201 lb (91.2 kg)  02/09/18 200 lb (90.7 kg)    Gen: Well NAD HEENT: EOMI,  MMM Lungs: Normal work of breathing. CTABL Heart: RRR no MRG Abd: NABS, Soft. Nondistended, Nontender Exts: Brisk capillary refill, warm and well perfused.   Lab and Radiology Results Results for orders placed or performed in visit on 05/04/19 (from the past 72 hour(s))  POCT HgB A1C     Status: Abnormal   Collection Time: 05/04/19  9:57 AM  Result Value Ref Range   Hemoglobin A1C 6.9 (A) 4.0 - 5.6 %   HbA1c POC (<> result, manual entry)     HbA1c, POC (prediabetic range)     HbA1c, POC (controlled diabetic range)     No results found.    Assessment and Plan: 73 y.o. female with  Diabetes: Slight increase in A1c.  Work on  diet and exercise continue metformin recheck in 5 months.  Hypertension: Blood pressure elevated today on recheck.  Plan for home blood pressure log and re-adherence to lifestyle modification.  Is still increased on self-reported blood pressure log in 1 month will likely increase blood pressure regimen.  Recheck in 5 months in person.  Fasting labs ordered today.  Hyperlipidemia: Clinically doing well but due for lab recheck.  Fasting labs ordered today.  Mood: Doing well continue current regimen.  Chronic cough: Doing well refill Tessalon for use intermittently.  Orders Placed This Encounter  Procedures  . CBC  . COMPLETE METABOLIC PANEL WITH GFR  . Lipid Panel w/reflex Direct LDL  . POCT HgB A1C   Meds ordered this encounter  Medications  . benzonatate (TESSALON) 200 MG capsule    Sig: Take 1 capsule (200 mg total) by mouth 3 (three) times daily as needed for cough.    Dispense:  90 capsule    Refill:  3     Historical information moved to improve visibility of documentation.  Past Medical History:  Diagnosis Date  . Diabetes (HCC)   . Hyperlipidemia   . Hypertension    Past Surgical History:  Procedure Laterality Date  . ABDOMINAL HYSTERECTOMY  1992  . gallbladder removed  1998   Social History   Tobacco Use  .  Smoking status: Former Smoker    Packs/day: 1.00    Years: 40.00    Pack years: 40.00    Last attempt to quit: 03/24/2008    Years since quitting: 11.1  . Smokeless tobacco: Never Used  Substance Use Topics  . Alcohol use: Yes   family history includes Diabetes in her brother and unknown relative; Hyperlipidemia in her unknown relative; Hypertension in her brother and unknown relative.  Medications: Current Outpatient Medications  Medication Sig Dispense Refill  . albuterol (PROVENTIL HFA;VENTOLIN HFA) 108 (90 Base) MCG/ACT inhaler Inhale 2 puffs into the lungs every 6 (six) hours as needed for wheezing or shortness of breath. 1 Inhaler 3  .  AMBULATORY NON FORMULARY MEDICATION Glucometer of choice with test strips lancets and control solution sufficient for testing daily.. Dispense quantity sufficient for 90 days dx Type 2 diabetes 250.00 90 strip 11  . aspirin 81 MG tablet Take 81 mg by mouth daily.    Marland Kitchen atorvastatin (LIPITOR) 20 MG tablet Take 1 tablet (20 mg total) by mouth daily. 90 tablet 1  . benzonatate (TESSALON) 200 MG capsule Take 1 capsule (200 mg total) by mouth 3 (three) times daily as needed for cough. 90 capsule 3  . irbesartan-hydrochlorothiazide (AVALIDE) 300-12.5 MG tablet Take 1 tablet by mouth daily. 90 tablet 3  . metFORMIN (GLUCOPHAGE) 500 MG tablet TAKE 1 TABLET EVERY MORNING  AND TAKE 1 TABLET  AT  DINNER  TIME 180 tablet 2  . sertraline (ZOLOFT) 50 MG tablet Take 1 tablet (50 mg total) by mouth daily. 90 tablet 1   No current facility-administered medications for this visit.    Allergies  Allergen Reactions  . Crestor [Rosuvastatin]     cough  . Nsaids   . Pravastatin Other (See Comments)    myalgias     Discussed warning signs or symptoms. Please see discharge instructions. Patient expresses understanding.

## 2019-05-05 LAB — COMPLETE METABOLIC PANEL WITH GFR
AG Ratio: 1.7 (calc) (ref 1.0–2.5)
ALT: 15 U/L (ref 6–29)
AST: 23 U/L (ref 10–35)
Albumin: 4.5 g/dL (ref 3.6–5.1)
Alkaline phosphatase (APISO): 70 U/L (ref 37–153)
BUN: 13 mg/dL (ref 7–25)
CO2: 28 mmol/L (ref 20–32)
Calcium: 9.9 mg/dL (ref 8.6–10.4)
Chloride: 100 mmol/L (ref 98–110)
Creat: 0.91 mg/dL (ref 0.60–0.93)
GFR, Est African American: 73 mL/min/{1.73_m2} (ref >=60)
GFR, Est Non African American: 63 mL/min/{1.73_m2} (ref >=60)
Globulin: 2.7 g/dL (calc) (ref 1.9–3.7)
Glucose, Bld: 124 mg/dL (ref 65–139)
Potassium: 4 mmol/L (ref 3.5–5.3)
Sodium: 137 mmol/L (ref 135–146)
Total Bilirubin: 0.4 mg/dL (ref 0.2–1.2)
Total Protein: 7.2 g/dL (ref 6.1–8.1)

## 2019-05-05 LAB — CBC
HCT: 42.4 % (ref 35.0–45.0)
Hemoglobin: 14.2 g/dL (ref 11.7–15.5)
MCH: 31.1 pg (ref 27.0–33.0)
MCHC: 33.5 g/dL (ref 32.0–36.0)
MCV: 92.8 fL (ref 80.0–100.0)
MPV: 9.8 fL (ref 7.5–12.5)
Platelets: 330 10*3/uL (ref 140–400)
RBC: 4.57 10*6/uL (ref 3.80–5.10)
RDW: 13.1 % (ref 11.0–15.0)
WBC: 7.1 10*3/uL (ref 3.8–10.8)

## 2019-05-05 LAB — LIPID PANEL W/REFLEX DIRECT LDL
Cholesterol: 141 mg/dL (ref <200)
HDL: 40 mg/dL — ABNORMAL LOW (ref >=50)
LDL Cholesterol (Calc): 80 mg/dL (calc)
Non-HDL Cholesterol (Calc): 101 mg/dL (calc) (ref <130)
Total CHOL/HDL Ratio: 3.5 (calc) (ref <5.0)
Triglycerides: 112 mg/dL (ref <150)

## 2019-05-14 DIAGNOSIS — H02831 Dermatochalasis of right upper eyelid: Secondary | ICD-10-CM | POA: Diagnosis not present

## 2019-05-14 DIAGNOSIS — E113291 Type 2 diabetes mellitus with mild nonproliferative diabetic retinopathy without macular edema, right eye: Secondary | ICD-10-CM | POA: Diagnosis not present

## 2019-05-14 DIAGNOSIS — H02834 Dermatochalasis of left upper eyelid: Secondary | ICD-10-CM | POA: Diagnosis not present

## 2019-05-14 DIAGNOSIS — H40003 Preglaucoma, unspecified, bilateral: Secondary | ICD-10-CM | POA: Diagnosis not present

## 2019-05-14 DIAGNOSIS — H527 Unspecified disorder of refraction: Secondary | ICD-10-CM | POA: Diagnosis not present

## 2019-05-14 DIAGNOSIS — H25813 Combined forms of age-related cataract, bilateral: Secondary | ICD-10-CM | POA: Diagnosis not present

## 2019-05-14 LAB — HM DIABETES EYE EXAM

## 2019-05-16 ENCOUNTER — Encounter: Payer: Self-pay | Admitting: Family Medicine

## 2019-05-16 NOTE — Progress Notes (Signed)
Received notes from [facility]  DOS 05/14/19 Glaucoma and mild DM retinopathy. Plan recheck in 1 year.

## 2019-05-22 ENCOUNTER — Telehealth: Payer: Self-pay | Admitting: Family Medicine

## 2019-05-22 NOTE — Telephone Encounter (Signed)
Left pt msg advising to keep medications all medications the same. Call back info provided in case of questions or concerns

## 2019-05-22 NOTE — Telephone Encounter (Signed)
Left message on VM for patient to call back

## 2019-05-22 NOTE — Telephone Encounter (Signed)
Patient mailed me her home blood pressure log.  Blood pressure ranges from systolic 938-182 over diastolic 99-37.  Please contact patient and say thank you for sending me the blood pressure.  Continue current blood pressure medications.  No need to change doses.

## 2019-06-11 ENCOUNTER — Other Ambulatory Visit: Payer: Self-pay | Admitting: Family Medicine

## 2019-06-21 ENCOUNTER — Encounter: Payer: Self-pay | Admitting: Family Medicine

## 2019-06-21 ENCOUNTER — Other Ambulatory Visit: Payer: Self-pay | Admitting: Family Medicine

## 2019-07-24 ENCOUNTER — Encounter: Payer: Self-pay | Admitting: Family Medicine

## 2019-08-13 ENCOUNTER — Other Ambulatory Visit: Payer: Self-pay | Admitting: Family Medicine

## 2019-10-04 ENCOUNTER — Other Ambulatory Visit: Payer: Self-pay

## 2019-10-04 ENCOUNTER — Encounter: Payer: Self-pay | Admitting: Family Medicine

## 2019-10-04 ENCOUNTER — Ambulatory Visit (INDEPENDENT_AMBULATORY_CARE_PROVIDER_SITE_OTHER): Payer: Medicare HMO | Admitting: Family Medicine

## 2019-10-04 VITALS — BP 136/79 | HR 85 | Temp 98.2°F | Wt 199.0 lb

## 2019-10-04 DIAGNOSIS — E11319 Type 2 diabetes mellitus with unspecified diabetic retinopathy without macular edema: Secondary | ICD-10-CM

## 2019-10-04 DIAGNOSIS — E1159 Type 2 diabetes mellitus with other circulatory complications: Secondary | ICD-10-CM

## 2019-10-04 DIAGNOSIS — I10 Essential (primary) hypertension: Secondary | ICD-10-CM

## 2019-10-04 DIAGNOSIS — R454 Irritability and anger: Secondary | ICD-10-CM | POA: Diagnosis not present

## 2019-10-04 DIAGNOSIS — Z1231 Encounter for screening mammogram for malignant neoplasm of breast: Secondary | ICD-10-CM

## 2019-10-04 DIAGNOSIS — R05 Cough: Secondary | ICD-10-CM | POA: Diagnosis not present

## 2019-10-04 DIAGNOSIS — Z0000 Encounter for general adult medical examination without abnormal findings: Secondary | ICD-10-CM | POA: Diagnosis not present

## 2019-10-04 DIAGNOSIS — Z6831 Body mass index (BMI) 31.0-31.9, adult: Secondary | ICD-10-CM

## 2019-10-04 DIAGNOSIS — E1121 Type 2 diabetes mellitus with diabetic nephropathy: Secondary | ICD-10-CM | POA: Diagnosis not present

## 2019-10-04 DIAGNOSIS — I152 Hypertension secondary to endocrine disorders: Secondary | ICD-10-CM

## 2019-10-04 DIAGNOSIS — R053 Chronic cough: Secondary | ICD-10-CM

## 2019-10-04 DIAGNOSIS — E785 Hyperlipidemia, unspecified: Secondary | ICD-10-CM | POA: Diagnosis not present

## 2019-10-04 LAB — POCT GLYCOSYLATED HEMOGLOBIN (HGB A1C): Hemoglobin A1C: 6.8 % — AB (ref 4.0–5.6)

## 2019-10-04 NOTE — Patient Instructions (Addendum)
Thank you for coming in today. Continue current treatment.  Work on low carbs.  Schedule mammogram for November.  Follow up in 6 months with Dr Zigmund Daniel.  Return as needed sooner.

## 2019-10-04 NOTE — Progress Notes (Signed)
Lisa Phelps is a 73 y.o. female who presents to [facility]: Primary Care Sports Medicine today for well adult visit.   Lisa Phelps is doing quite well.  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  Diabetes: Managed with 500 mg Metformin twice daily as well as reduced carbohydrate diet and exercise.  Blood sugars are typically very well controlled.  Hyperlipidemia: Managed with atorvastatin 20 mg daily.  Patient tolerates it well with no issues.  Hypertension: Managed with Avalide 300/12.5.  Doing well with no issues.  In addition patient has a history of anxiety/irritability.  This is been typically well managed with Zoloft 50 mg daily.  Cough Well managed tessalon.     ROS as above:  Past Medical History:  Diagnosis Date  . Diabetes (HCC)   . Hyperlipidemia   . Hypertension    Past Surgical History:  Procedure Laterality Date  . ABDOMINAL HYSTERECTOMY  1992  . gallbladder removed  1998   Social History   Tobacco Use  . Smoking status: Former Smoker    Packs/day: 1.00    Years: 40.00    Pack years: 40.00    Quit date: 03/24/2008    Years since quitting: 11.5  . Smokeless tobacco: Never Used  Substance Use Topics  . Alcohol use: Yes   family history includes Diabetes in her brother and unknown relative; Hyperlipidemia in her unknown relative; Hypertension in her brother and unknown relative.  Medications: Current Outpatient Medications  Medication Sig Dispense Refill  . albuterol (PROVENTIL HFA;VENTOLIN HFA) 108 (90 Base) MCG/ACT inhaler Inhale 2 puffs into the lungs every 6 (six) hours as needed for wheezing or shortness of breath. 1 Inhaler 3  . AMBULATORY NON FORMULARY MEDICATION Glucometer of choice with test strips lancets and control solution sufficient for testing daily.. Dispense quantity sufficient for 90 days dx Type 2 diabetes  250.00 90 strip 11  . aspirin 81 MG tablet Take 81 mg by mouth daily.    Marland Kitchen atorvastatin (LIPITOR) 20 MG tablet TAKE 1 TABLET (20 MG TOTAL) BY MOUTH DAILY. 90 tablet 3  . benzonatate (TESSALON) 200 MG capsule TAKE 1 CAPSULE  3  TIMES DAILY AS NEEDED FOR COUGH. 90 capsule 3  . irbesartan-hydrochlorothiazide (AVALIDE) 300-12.5 MG tablet TAKE 1 TABLET ONE TIME DAILY   90 tablet 3  . metFORMIN (GLUCOPHAGE) 500 MG tablet TAKE 1 TABLET EVERY MORNING  AND TAKE 1 TABLET  AT  DINNER  TIME 180 tablet 2  . sertraline (ZOLOFT) 50 MG tablet Take 1 tablet (50 mg total) by mouth daily. 90 tablet 1   No current facility-administered medications for this visit.    Allergies  Allergen Reactions  . Crestor [Rosuvastatin]     cough  . Nsaids   . Pravastatin Other (See Comments)    myalgias    Health Maintenance Health Maintenance  Topic Date Due  . HEMOGLOBIN A1C  11/04/2019  . FOOT EXAM  05/03/2020  . OPHTHALMOLOGY EXAM  05/13/2020  . MAMMOGRAM  11/01/2020  . COLONOSCOPY  05/13/2024  . TETANUS/TDAP  07/16/2026  . INFLUENZA VACCINE  Completed  . DEXA SCAN  Completed  . Hepatitis C Screening  Completed  . PNA vac Low Risk Adult  Completed     Exam:  BP 136/79   Pulse 85   Temp 98.2 F (36.8 C) (Oral)   Wt 199 lb (90.3 kg)   BMI 31.17 kg/m   Wt Readings from  Last 5 Encounters:  10/04/19 199 lb (90.3 kg)  05/04/19 200 lb (90.7 kg)  11/08/18 198 lb (89.8 kg)  08/09/18 200 lb (90.7 kg)  05/09/18 201 lb (91.2 kg)      Gen: Well NAD HEENT: EOMI,  MMM Lungs: Normal work of breathing. CTABL Heart: RRR no MRG Abd: NABS, Soft. Nondistended, Nontender Exts: Brisk capillary refill, warm and well perfused.  Psych alert and oriented normal speech thought process and affect.    Lab and Radiology Results Hemoglobin A1c point-of-care today was 6.8. (Note was incorrectly entered into the chart as 8.6.)   Assessment and Plan: 73 y.o. female with  Well adult.  Doing reasonably well  overall.  Diabetes: Controlled continue current regimen.  Recheck in 6 months with new PCP Dr. Zigmund Daniel.  Hypertension: Also controlled continue current regimen.  Recheck 6 months.  Anticipate getting labs at that point.  Hyperlipidemia controlled when last assessed in May 2020.  Continue current regimen recheck fasting labs in 6 months.  Continue to work on diet exercise.  Patient will be due for mammogram November 20.  Mammogram ordered and will be scheduled for about 1 month from now.  PDMP not reviewed this encounter. Orders Placed This Encounter  Procedures  . MM 3D SCREEN BREAST BILATERAL    Standing Status:   Future    Standing Expiration Date:   12/03/2020    Order Specific Question:   Reason for Exam (SYMPTOM  OR DIAGNOSIS REQUIRED)    Answer:   screen breast cancer    Order Specific Question:   Preferred imaging location?    Answer:   [facility]  . POCT HgB A1C   No orders of the defined types were placed in this encounter.    Discussed warning signs or symptoms. Please see discharge instructions. Patient expresses understanding.

## 2019-10-26 ENCOUNTER — Other Ambulatory Visit: Payer: Self-pay | Admitting: Family Medicine

## 2019-11-01 DIAGNOSIS — M545 Low back pain: Secondary | ICD-10-CM | POA: Diagnosis not present

## 2019-11-01 DIAGNOSIS — Z886 Allergy status to analgesic agent status: Secondary | ICD-10-CM | POA: Diagnosis not present

## 2019-11-01 DIAGNOSIS — Z7982 Long term (current) use of aspirin: Secondary | ICD-10-CM | POA: Diagnosis not present

## 2019-11-01 DIAGNOSIS — M1611 Unilateral primary osteoarthritis, right hip: Secondary | ICD-10-CM | POA: Diagnosis not present

## 2019-11-01 DIAGNOSIS — Z888 Allergy status to other drugs, medicaments and biological substances status: Secondary | ICD-10-CM | POA: Diagnosis not present

## 2019-11-01 DIAGNOSIS — E119 Type 2 diabetes mellitus without complications: Secondary | ICD-10-CM | POA: Diagnosis not present

## 2019-11-01 DIAGNOSIS — Z79899 Other long term (current) drug therapy: Secondary | ICD-10-CM | POA: Diagnosis not present

## 2019-11-01 DIAGNOSIS — M25551 Pain in right hip: Secondary | ICD-10-CM | POA: Diagnosis not present

## 2019-11-01 DIAGNOSIS — I10 Essential (primary) hypertension: Secondary | ICD-10-CM | POA: Diagnosis not present

## 2019-11-01 DIAGNOSIS — Z87891 Personal history of nicotine dependence: Secondary | ICD-10-CM | POA: Diagnosis not present

## 2019-11-01 DIAGNOSIS — M1991 Primary osteoarthritis, unspecified site: Secondary | ICD-10-CM | POA: Diagnosis not present

## 2019-11-07 ENCOUNTER — Ambulatory Visit (INDEPENDENT_AMBULATORY_CARE_PROVIDER_SITE_OTHER): Payer: Medicare HMO

## 2019-11-07 ENCOUNTER — Other Ambulatory Visit: Payer: Self-pay

## 2019-11-07 DIAGNOSIS — Z1231 Encounter for screening mammogram for malignant neoplasm of breast: Secondary | ICD-10-CM | POA: Diagnosis not present

## 2019-11-13 DIAGNOSIS — R69 Illness, unspecified: Secondary | ICD-10-CM | POA: Diagnosis not present

## 2019-11-28 ENCOUNTER — Other Ambulatory Visit: Payer: Self-pay | Admitting: Family Medicine

## 2020-01-15 ENCOUNTER — Ambulatory Visit (INDEPENDENT_AMBULATORY_CARE_PROVIDER_SITE_OTHER): Payer: Medicare HMO | Admitting: Family Medicine

## 2020-01-15 ENCOUNTER — Encounter: Payer: Self-pay | Admitting: Family Medicine

## 2020-01-15 DIAGNOSIS — R053 Chronic cough: Secondary | ICD-10-CM

## 2020-01-15 DIAGNOSIS — E1159 Type 2 diabetes mellitus with other circulatory complications: Secondary | ICD-10-CM | POA: Diagnosis not present

## 2020-01-15 DIAGNOSIS — I10 Essential (primary) hypertension: Secondary | ICD-10-CM

## 2020-01-15 DIAGNOSIS — E1121 Type 2 diabetes mellitus with diabetic nephropathy: Secondary | ICD-10-CM

## 2020-01-15 DIAGNOSIS — R05 Cough: Secondary | ICD-10-CM

## 2020-01-15 DIAGNOSIS — M25559 Pain in unspecified hip: Secondary | ICD-10-CM

## 2020-01-15 DIAGNOSIS — M25551 Pain in right hip: Secondary | ICD-10-CM | POA: Insufficient documentation

## 2020-01-15 DIAGNOSIS — E785 Hyperlipidemia, unspecified: Secondary | ICD-10-CM | POA: Diagnosis not present

## 2020-01-15 DIAGNOSIS — M25552 Pain in left hip: Secondary | ICD-10-CM | POA: Insufficient documentation

## 2020-01-15 NOTE — Assessment & Plan Note (Signed)
Lab Results  Component Value Date   LDLCALC 80 05/04/2019    Tolerating atorvastatin well.  Continue.

## 2020-01-15 NOTE — Assessment & Plan Note (Signed)
Blood pressure is at goal at for age and co-morbidities.  I recommend she continue current dose of irbesartan-hctz.  In addition they were instructed to follow a low sodium diet with regular exercise to help to maintain adequate control of blood pressure.

## 2020-01-15 NOTE — Patient Instructions (Signed)
Great to meet you today! Continue current medications and see me again in 3 months.  Schedule with Dr. Karie Schwalbe to discuss repeat hip injection.

## 2020-01-15 NOTE — Assessment & Plan Note (Signed)
Well controlled with tessalon as needed.

## 2020-01-15 NOTE — Assessment & Plan Note (Signed)
Reported blood sugars at home are well controlled.  Continue current medication.  Will update labs at next f/u in 3 months.

## 2020-01-15 NOTE — Progress Notes (Signed)
Lisa Phelps - 74 y.o. female MRN [id number]  Date of birth: 1946-04-18  Subjective Chief Complaint  Patient presents with  . Diabetes  . Hip Pain    HPI Lisa Phelps is a 74 y.o. female with history of HTN, T2DM, chronic cough and OA here today for routine follow up.   -HTN:  Doing well with current medication of irbesartan-hctz.  She denies side effects or symptoms of hypotension.  She is without chest pain, shortness of breath, palpitations or headache.   -T2DM:  Current management with metformin.  Doing well with this and blood sugars at home ranging form 90-125.  She does walk some for exercise but is limited some due to her OA.  She is on atorvastatin as well for associated HLD.    -OA:  History of OA of the hips and knees.  Has had injections to hip and knee previously with good results.  She feels like she may need repeat injection to hips as she is having some stiffness and pain.  She denies recent falls or locking sensation of hip or knee.    -Chronic cough:  Previous smoker.  Chronic cough for several years.  Using tessalon with good results.   ROS:  A comprehensive ROS was completed and negative except as noted per HPI   Allergies  Allergen Reactions  . Crestor [Rosuvastatin]     cough  . Nsaids   . Pravastatin Other (See Comments)    myalgias    Past Medical History:  Diagnosis Date  . Diabetes (HCC)   . Hyperlipidemia   . Hypertension     Past Surgical History:  Procedure Laterality Date  . ABDOMINAL HYSTERECTOMY  1992  . gallbladder removed  1998    Social History   Socioeconomic History  . Marital status: Married    Spouse name: Not on file  . Number of children: Not on file  . Years of education: Not on file  . Highest education level: Not on file  Occupational History  . Not on file  Tobacco Use  . Smoking status: Former Smoker    Packs/day: 1.00    Years: 40.00    Pack years: 40.00    Quit date: 03/24/2008    Years since quitting: 11.8  .  Smokeless tobacco: Never Used  Substance and Sexual Activity  . Alcohol use: Yes  . Drug use: No  . Sexual activity: Never  Other Topics Concern  . Not on file  Social History Narrative  . Not on file   Social Determinants of Health   Financial Resource Strain:   . Difficulty of Paying Living Expenses: Not on file  Food Insecurity:   . Worried About Programme researcher, broadcasting/film/video in the Last Year: Not on file  . Ran Out of Food in the Last Year: Not on file  Transportation Needs:   . Lack of Transportation (Medical): Not on file  . Lack of Transportation (Non-Medical): Not on file  Physical Activity:   . Days of Exercise per Week: Not on file  . Minutes of Exercise per Session: Not on file  Stress:   . Feeling of Stress : Not on file  Social Connections:   . Frequency of Communication with Friends and Family: Not on file  . Frequency of Social Gatherings with Friends and Family: Not on file  . Attends Religious Services: Not on file  . Active Member of Clubs or Organizations: Not on file  . Attends Banker  Meetings: Not on file  . Marital Status: Not on file    Family History  Problem Relation Age of Onset  . Diabetes Other   . Hyperlipidemia Other   . Hypertension Other   . Diabetes Brother   . Hypertension Brother     Health Maintenance  Topic Date Due  . HEMOGLOBIN A1C  04/03/2020  . FOOT EXAM  05/03/2020  . OPHTHALMOLOGY EXAM  05/13/2020  . MAMMOGRAM  11/06/2021  . COLONOSCOPY  05/13/2024  . TETANUS/TDAP  07/16/2026  . INFLUENZA VACCINE  Completed  . DEXA SCAN  Completed  . Hepatitis C Screening  Completed  . PNA vac Low Risk Adult  Completed    ----------------------------------------------------------------------------------------------------------------------------------------------------------------------------------------------------------------- Physical Exam BP (!) 138/92   Pulse 92   Temp 98.1 F (36.7 C) (Oral)   Ht 5\' 7"  (1.702 m)   Wt  201 lb (91.2 kg)   BMI 31.48 kg/m   Physical Exam Constitutional:      Appearance: Normal appearance.  HENT:     Head: Normocephalic.  Eyes:     General: No scleral icterus. Cardiovascular:     Rate and Rhythm: Normal rate and regular rhythm.  Pulmonary:     Effort: Pulmonary effort is normal.     Breath sounds: Normal breath sounds.  Musculoskeletal:     Cervical back: Neck supple.  Neurological:     General: No focal deficit present.     Mental Status: She is alert.  Psychiatric:        Mood and Affect: Mood normal.        Behavior: Behavior normal.     ------------------------------------------------------------------------------------------------------------------------------------------------------------------------------------------------------------------- Assessment and Plan  Hypertension associated with diabetes ([place]) Blood pressure is at goal at for age and co-morbidities.  I recommend she continue current dose of irbesartan-hctz.  In addition they were instructed to follow a low sodium diet with regular exercise to help to maintain adequate control of blood pressure.     Type 2 diabetes mellitus Reported blood sugars at home are well controlled.  Continue current medication.  Will update labs at next f/u in 3 months.   Chronic cough Well controlled with tessalon as needed.   Hyperlipidemia LDL goal <100 Lab Results  Component Value Date   LDLCALC 80 05/04/2019    Tolerating atorvastatin well.  Continue.  Hip pain She will schedule with Dr. Darene Lamer to discuss updated hip injection.     This visit occurred during the SARS-CoV-2 public health emergency.  Safety protocols were in place, including screening questions prior to the visit, additional usage of staff PPE, and extensive cleaning of exam room while observing appropriate contact time as indicated for disinfecting solutions.

## 2020-01-15 NOTE — Assessment & Plan Note (Signed)
She will schedule with Dr. Karie Schwalbe to discuss updated hip injection.

## 2020-01-28 ENCOUNTER — Other Ambulatory Visit: Payer: Self-pay

## 2020-01-28 ENCOUNTER — Ambulatory Visit (INDEPENDENT_AMBULATORY_CARE_PROVIDER_SITE_OTHER): Payer: Medicare HMO | Admitting: Sports Medicine

## 2020-01-28 ENCOUNTER — Encounter: Payer: Self-pay | Admitting: Sports Medicine

## 2020-01-28 ENCOUNTER — Ambulatory Visit (INDEPENDENT_AMBULATORY_CARE_PROVIDER_SITE_OTHER): Payer: Medicare HMO

## 2020-01-28 DIAGNOSIS — M47816 Spondylosis without myelopathy or radiculopathy, lumbar region: Secondary | ICD-10-CM | POA: Diagnosis not present

## 2020-01-28 DIAGNOSIS — M25552 Pain in left hip: Secondary | ICD-10-CM | POA: Diagnosis not present

## 2020-01-28 DIAGNOSIS — M1612 Unilateral primary osteoarthritis, left hip: Secondary | ICD-10-CM | POA: Diagnosis not present

## 2020-01-28 DIAGNOSIS — M4316 Spondylolisthesis, lumbar region: Secondary | ICD-10-CM

## 2020-01-28 DIAGNOSIS — M25551 Pain in right hip: Secondary | ICD-10-CM | POA: Diagnosis not present

## 2020-01-28 DIAGNOSIS — R1030 Lower abdominal pain, unspecified: Secondary | ICD-10-CM | POA: Diagnosis not present

## 2020-01-28 MED ORDER — ACETAMINOPHEN ER 650 MG PO TBCR: 650.0000 mg | EXTENDED_RELEASE_TABLET | Freq: Three times a day (TID) | ORAL | 3 refills | Status: AC | PRN

## 2020-01-28 NOTE — Progress Notes (Signed)
    Procedures performed today:    None.  Independent interpretation of tests performed by another provider:   X-rays from a year ago showed mild hip osteoarthritis on the right.  Impression and Recommendations:    Bilateral hip pain For years this pleasant 74 year old female has had bilateral hip pain that she localizes from the groin deep into the buttock, worse with the first few steps. Moderate, persistent. Reproduction of pain with internal rotation consistent with hip osteoarthritis. She gets epigastric discomfort with NSAIDs so we are going to do arthritis strength Tylenol 3 times daily. Adding x-rays of her hips and lumbar spine. Home rehab exercises, return to see me in 4 to 6 weeks, we can do bilateral femoroacetabular joint injections if no better.    ___________________________________________ Ihor Austin. Benjamin Stain, M.D., ABFM., CAQSM. Primary Care and Sports Medicine [facility]  Adjunct Instructor of Family Medicine  [employer]

## 2020-01-28 NOTE — Assessment & Plan Note (Signed)
For years this pleasant 74 year old female has had bilateral hip pain that she localizes from the groin deep into the buttock, worse with the first few steps. Moderate, persistent. Reproduction of pain with internal rotation consistent with hip osteoarthritis. She gets epigastric discomfort with NSAIDs so we are going to do arthritis strength Tylenol 3 times daily. Adding x-rays of her hips and lumbar spine. Home rehab exercises, return to see me in 4 to 6 weeks, we can do bilateral femoroacetabular joint injections if no better.

## 2020-02-25 ENCOUNTER — Other Ambulatory Visit: Payer: Self-pay

## 2020-02-25 ENCOUNTER — Ambulatory Visit (INDEPENDENT_AMBULATORY_CARE_PROVIDER_SITE_OTHER): Payer: Medicare HMO | Admitting: Sports Medicine

## 2020-02-25 DIAGNOSIS — M25552 Pain in left hip: Secondary | ICD-10-CM

## 2020-02-25 DIAGNOSIS — M25551 Pain in right hip: Secondary | ICD-10-CM | POA: Diagnosis not present

## 2020-02-25 NOTE — Assessment & Plan Note (Addendum)
Lisa Phelps is a pleasant 74 year old female, she has mild bilateral hip osteoarthritis, we treated her conservatively with Tylenol 650 mg p.o. thrice daily and home rehab exercises, she has responded completely, she can continue her rehab and return to see me as needed, I am happy to do bilateral femoroacetabular joint injections with ultrasound guidance if needed. Of note she gets epigastric discomfort with NSAIDs

## 2020-02-25 NOTE — Progress Notes (Signed)
    Procedures performed today:    None.  Independent interpretation of notes and tests performed by another provider:   None.  Impression and Recommendations:    Bilateral hip pain Lisa Phelps is a pleasant 74 year old female, she has mild bilateral hip osteoarthritis, we treated her conservatively with Tylenol 650 mg p.o. thrice daily and home rehab exercises, she has responded completely, she can continue her rehab and return to see me as needed, I am happy to do bilateral femoroacetabular joint injections with ultrasound guidance if needed. Of note she gets epigastric discomfort with NSAIDs    ___________________________________________ Ihor Austin. Benjamin Stain, M.D., ABFM., CAQSM. Primary Care and Sports Medicine [facility]  Adjunct Instructor of Family Medicine  [employer]

## 2020-03-05 ENCOUNTER — Other Ambulatory Visit: Payer: Self-pay | Admitting: Family Medicine

## 2020-03-11 MED ORDER — SERTRALINE HCL 50 MG PO TABS: 50.0000 mg | ORAL_TABLET | Freq: Every day | ORAL | 2 refills | Status: DC

## 2020-03-11 MED ORDER — ATORVASTATIN CALCIUM 20 MG PO TABS: 20.0000 mg | ORAL_TABLET | Freq: Every day | ORAL | 2 refills | Status: DC

## 2020-03-11 NOTE — Progress Notes (Signed)
Patient called and needed an update of her Zoloft and Atorvastatin. It has been sent to the pharmacy and patient is aware.

## 2020-03-11 NOTE — Addendum Note (Signed)
Addended by: Arvilla Market on: 03/11/2020 03:20 PM   Modules accepted: Orders

## 2020-03-19 ENCOUNTER — Other Ambulatory Visit: Payer: Self-pay | Admitting: Family Medicine

## 2020-04-14 ENCOUNTER — Encounter: Payer: Self-pay | Admitting: Family Medicine

## 2020-04-14 ENCOUNTER — Other Ambulatory Visit: Payer: Self-pay

## 2020-04-14 ENCOUNTER — Ambulatory Visit (INDEPENDENT_AMBULATORY_CARE_PROVIDER_SITE_OTHER): Payer: Medicare HMO | Admitting: Family Medicine

## 2020-04-14 VITALS — BP 156/92 | HR 88 | Ht 66.93 in | Wt 199.9 lb

## 2020-04-14 DIAGNOSIS — I10 Essential (primary) hypertension: Secondary | ICD-10-CM | POA: Diagnosis not present

## 2020-04-14 DIAGNOSIS — E785 Hyperlipidemia, unspecified: Secondary | ICD-10-CM | POA: Diagnosis not present

## 2020-04-14 DIAGNOSIS — E1159 Type 2 diabetes mellitus with other circulatory complications: Secondary | ICD-10-CM

## 2020-04-14 DIAGNOSIS — E1121 Type 2 diabetes mellitus with diabetic nephropathy: Secondary | ICD-10-CM

## 2020-04-14 DIAGNOSIS — I152 Hypertension secondary to endocrine disorders: Secondary | ICD-10-CM

## 2020-04-14 NOTE — Assessment & Plan Note (Signed)
Update a1c today.  Continue metformin.  Continue weight loss efforts.

## 2020-04-14 NOTE — Assessment & Plan Note (Signed)
BP elevated today, a little better on re-check.  Has not taken BP meds today.  Reports well controlled at home, she will continue to monitor.

## 2020-04-14 NOTE — Patient Instructions (Signed)
Great to see you today.  Continue current medications.  We'll be in touch with lab results.   

## 2020-04-14 NOTE — Progress Notes (Signed)
Lisa Phelps - 74 y.o. female MRN [id number]  Date of birth: 04-Aug-1946  Subjective No chief complaint on file.   HPI Lisa Phelps is a 74 y.o. female here today for follow up of HTN, HLD and DM.  She reports that she is doing well, no new concerns today.  She is planning on getting COVID vaccine.   -HTN:  Current treatment irbesartan/hctz.  Doing well with this.  She did not take this morning because she is fasting for labs.  BP is a little elevated.  She denies symptoms related to this including chest pain, shortness of breath, palpitations, headache or vision changes.   -DM:  Current management with metformin.  She is taking this as directed and denies side effects.  She does not check blood sugars at home very often.  She has been working on weight loss.  Down 2lbs since last visit.   -HLD:  Tolerating  Atorvastatin well.  She has not had side effects including myalgias or GI upset.    ROS:  A comprehensive ROS was completed and negative except as noted per HPI   Allergies  Allergen Reactions  . Crestor [Rosuvastatin]     cough  . Nsaids   . Pravastatin Other (See Comments)    myalgias    Past Medical History:  Diagnosis Date  . Diabetes (HCC)   . Hyperlipidemia   . Hypertension     Past Surgical History:  Procedure Laterality Date  . ABDOMINAL HYSTERECTOMY  1992  . gallbladder removed  1998    Social History   Socioeconomic History  . Marital status: Married    Spouse name: Not on file  . Number of children: Not on file  . Years of education: Not on file  . Highest education level: Not on file  Occupational History  . Not on file  Tobacco Use  . Smoking status: Former Smoker    Packs/day: 1.00    Years: 40.00    Pack years: 40.00    Quit date: 03/24/2008    Years since quitting: 12.0  . Smokeless tobacco: Never Used  Substance and Sexual Activity  . Alcohol use: Yes  . Drug use: No  . Sexual activity: Never  Other Topics Concern  . Not on file   Social History Narrative  . Not on file   Social Determinants of Health   Financial Resource Strain:   . Difficulty of Paying Living Expenses:   Food Insecurity:   . Worried About Programme researcher, broadcasting/film/video in the Last Year:   . Barista in the Last Year:   Transportation Needs:   . Freight forwarder (Medical):   Marland Kitchen Lack of Transportation (Non-Medical):   Physical Activity:   . Days of Exercise per Week:   . Minutes of Exercise per Session:   Stress:   . Feeling of Stress :   Social Connections:   . Frequency of Communication with Friends and Family:   . Frequency of Social Gatherings with Friends and Family:   . Attends Religious Services:   . Active Member of Clubs or Organizations:   . Attends Banker Meetings:   Marland Kitchen Marital Status:     Family History  Problem Relation Age of Onset  . Diabetes Other   . Hyperlipidemia Other   . Hypertension Other   . Diabetes Brother   . Hypertension Brother     Health Maintenance  Topic Date Due  . HEMOGLOBIN A1C  04/03/2020  .  COVID-19 Vaccine (1) 05/13/2020 (Originally 12/28/1961)  . FOOT EXAM  05/03/2020  . OPHTHALMOLOGY EXAM  05/13/2020  . INFLUENZA VACCINE  07/13/2020  . MAMMOGRAM  11/06/2021  . COLONOSCOPY  05/13/2024  . TETANUS/TDAP  07/16/2026  . DEXA SCAN  Completed  . Hepatitis C Screening  Completed  . PNA vac Low Risk Adult  Completed     ----------------------------------------------------------------------------------------------------------------------------------------------------------------------------------------------------------------- Physical Exam BP (!) 146/101 (BP Location: Left Arm, Patient Position: Sitting, Cuff Size: Normal)   Pulse 92   Ht 5' 6.93" (1.7 m)   Wt 199 lb 14.4 oz (90.7 kg)   SpO2 95%   BMI 31.38 kg/m   Physical Exam Constitutional:      Appearance: Normal appearance.  HENT:     Head: Normocephalic and atraumatic.     Mouth/Throat:     Mouth: Mucous  membranes are moist.  Eyes:     General: No scleral icterus. Cardiovascular:     Rate and Rhythm: Normal rate and regular rhythm.  Pulmonary:     Effort: Pulmonary effort is normal.     Breath sounds: Normal breath sounds.  Musculoskeletal:     Cervical back: Neck supple.  Skin:    General: Skin is warm and dry.  Neurological:     General: No focal deficit present.     Mental Status: She is alert.  Psychiatric:        Mood and Affect: Mood normal.        Behavior: Behavior normal.     ------------------------------------------------------------------------------------------------------------------------------------------------------------------------------------------------------------------- Assessment and Plan  Hypertension associated with diabetes (HCC) BP elevated today, a little better on re-check.  Has not taken BP meds today.  Reports well controlled at home, she will continue to monitor.    Type 2 diabetes mellitus Update a1c today.  Continue metformin.  Continue weight loss efforts.    Hyperlipidemia LDL goal <100 Tolerating atorvastatin well.  Update Lipid panel.    No orders of the defined types were placed in this encounter.   Return in about 6 months (around 10/15/2020) for HTN/DM.    This visit occurred during the SARS-CoV-2 public health emergency.  Safety protocols were in place, including screening questions prior to the visit, additional usage of staff PPE, and extensive cleaning of exam room while observing appropriate contact time as indicated for disinfecting solutions.

## 2020-04-14 NOTE — Assessment & Plan Note (Signed)
Tolerating atorvastatin well.  Update Lipid panel.

## 2020-04-15 LAB — COMPLETE METABOLIC PANEL WITH GFR
AG Ratio: 1.5 (calc) (ref 1.0–2.5)
ALT: 18 U/L (ref 6–29)
AST: 23 U/L (ref 10–35)
Albumin: 4.3 g/dL (ref 3.6–5.1)
Alkaline phosphatase (APISO): 71 U/L (ref 37–153)
BUN: 13 mg/dL (ref 7–25)
CO2: 28 mmol/L (ref 20–32)
Calcium: 9.8 mg/dL (ref 8.6–10.4)
Chloride: 103 mmol/L (ref 98–110)
Creat: 0.9 mg/dL (ref 0.60–0.93)
GFR, Est African American: 74 mL/min/{1.73_m2} (ref >=60)
GFR, Est Non African American: 63 mL/min/{1.73_m2} (ref >=60)
Globulin: 2.8 g/dL (calc) (ref 1.9–3.7)
Glucose, Bld: 143 mg/dL — ABNORMAL HIGH (ref 65–99)
Potassium: 4.3 mmol/L (ref 3.5–5.3)
Sodium: 140 mmol/L (ref 135–146)
Total Bilirubin: 0.3 mg/dL (ref 0.2–1.2)
Total Protein: 7.1 g/dL (ref 6.1–8.1)

## 2020-04-15 LAB — CBC
HCT: 41 % (ref 35.0–45.0)
Hemoglobin: 13.6 g/dL (ref 11.7–15.5)
MCH: 30.9 pg (ref 27.0–33.0)
MCHC: 33.2 g/dL (ref 32.0–36.0)
MCV: 93.2 fL (ref 80.0–100.0)
MPV: 9.6 fL (ref 7.5–12.5)
Platelets: 325 10*3/uL (ref 140–400)
RBC: 4.4 10*6/uL (ref 3.80–5.10)
RDW: 12.8 % (ref 11.0–15.0)
WBC: 6.3 10*3/uL (ref 3.8–10.8)

## 2020-04-15 LAB — HEMOGLOBIN A1C
Hgb A1c MFr Bld: 6.8 % of total Hgb — ABNORMAL HIGH (ref <5.7)
Mean Plasma Glucose: 148 (calc)
eAG (mmol/L): 8.2 (calc)

## 2020-04-15 LAB — LIPID PANEL
Cholesterol: 142 mg/dL (ref <200)
HDL: 40 mg/dL — ABNORMAL LOW (ref >=50)
LDL Cholesterol (Calc): 82 mg/dL (calc)
Non-HDL Cholesterol (Calc): 102 mg/dL (calc) (ref <130)
Total CHOL/HDL Ratio: 3.6 (calc) (ref <5.0)
Triglycerides: 100 mg/dL (ref <150)

## 2020-05-21 ENCOUNTER — Other Ambulatory Visit: Payer: Self-pay | Admitting: Physician Assistant

## 2020-06-25 ENCOUNTER — Other Ambulatory Visit: Payer: Self-pay | Admitting: Physician Assistant

## 2020-07-01 DIAGNOSIS — H25813 Combined forms of age-related cataract, bilateral: Secondary | ICD-10-CM | POA: Diagnosis not present

## 2020-07-01 DIAGNOSIS — H527 Unspecified disorder of refraction: Secondary | ICD-10-CM | POA: Diagnosis not present

## 2020-07-01 DIAGNOSIS — H40003 Preglaucoma, unspecified, bilateral: Secondary | ICD-10-CM | POA: Diagnosis not present

## 2020-07-01 DIAGNOSIS — H02834 Dermatochalasis of left upper eyelid: Secondary | ICD-10-CM | POA: Diagnosis not present

## 2020-07-01 DIAGNOSIS — H02831 Dermatochalasis of right upper eyelid: Secondary | ICD-10-CM | POA: Diagnosis not present

## 2020-07-01 DIAGNOSIS — E113292 Type 2 diabetes mellitus with mild nonproliferative diabetic retinopathy without macular edema, left eye: Secondary | ICD-10-CM | POA: Diagnosis not present

## 2020-08-07 ENCOUNTER — Other Ambulatory Visit: Payer: Self-pay

## 2020-08-08 ENCOUNTER — Other Ambulatory Visit: Payer: Self-pay

## 2020-08-15 ENCOUNTER — Other Ambulatory Visit: Payer: Self-pay | Admitting: Family Medicine

## 2020-08-15 MED ORDER — BENZONATATE 200 MG PO CAPS: ORAL_CAPSULE | ORAL | 1 refills | Status: DC

## 2020-09-24 ENCOUNTER — Other Ambulatory Visit: Payer: Self-pay | Admitting: Family Medicine

## 2020-09-29 ENCOUNTER — Other Ambulatory Visit: Payer: Self-pay | Admitting: Family Medicine

## 2020-09-30 ENCOUNTER — Other Ambulatory Visit: Payer: Self-pay | Admitting: Family Medicine

## 2020-09-30 DIAGNOSIS — Z1231 Encounter for screening mammogram for malignant neoplasm of breast: Secondary | ICD-10-CM

## 2020-10-15 ENCOUNTER — Other Ambulatory Visit: Payer: Self-pay

## 2020-10-15 ENCOUNTER — Ambulatory Visit (INDEPENDENT_AMBULATORY_CARE_PROVIDER_SITE_OTHER): Payer: Medicare HMO | Admitting: Family Medicine

## 2020-10-15 ENCOUNTER — Encounter: Payer: Self-pay | Admitting: Family Medicine

## 2020-10-15 VITALS — BP 146/75 | HR 95 | Ht 66.93 in | Wt 198.0 lb

## 2020-10-15 DIAGNOSIS — E1159 Type 2 diabetes mellitus with other circulatory complications: Secondary | ICD-10-CM | POA: Diagnosis not present

## 2020-10-15 DIAGNOSIS — I152 Hypertension secondary to endocrine disorders: Secondary | ICD-10-CM

## 2020-10-15 DIAGNOSIS — I700 Atherosclerosis of aorta: Secondary | ICD-10-CM | POA: Diagnosis not present

## 2020-10-15 DIAGNOSIS — E1121 Type 2 diabetes mellitus with diabetic nephropathy: Secondary | ICD-10-CM

## 2020-10-15 DIAGNOSIS — E1122 Type 2 diabetes mellitus with diabetic chronic kidney disease: Secondary | ICD-10-CM | POA: Diagnosis not present

## 2020-10-15 DIAGNOSIS — N181 Chronic kidney disease, stage 1: Secondary | ICD-10-CM | POA: Diagnosis not present

## 2020-10-15 LAB — POCT GLYCOSYLATED HEMOGLOBIN (HGB A1C): Hemoglobin A1C: 6.8 % — AB (ref 4.0–5.6)

## 2020-10-15 MED ORDER — METFORMIN HCL 500 MG PO TABS: ORAL_TABLET | ORAL | 1 refills | Status: DC

## 2020-10-15 NOTE — Assessment & Plan Note (Signed)
Most recent A1c of  Lab Results  Component Value Date   HGBA1C 6.8 (A) 10/15/2020   indicates diabetes is well controlled.  She will continue metformin at current strength and dosing.  Counseled on healthy, low carb diet and recommend frequent activity to help with maintaining good control of blood sugars.

## 2020-10-15 NOTE — Assessment & Plan Note (Signed)
Noted on recent L spine films.   Continue statin, keep BP and diabetes well controlled.

## 2020-10-15 NOTE — Assessment & Plan Note (Signed)
Blood pressure is mildly elevated today in clinic, readings at home are better. I recommend that she continue current medications and monitoring at home.  In addition they were instructed to follow a low sodium diet with regular exercise to help to maintain adequate control of blood pressure.

## 2020-10-15 NOTE — Patient Instructions (Signed)
Great to see you today! Continue current medications.  See me again in 6 months.  

## 2020-10-15 NOTE — Progress Notes (Signed)
Lisa Phelps - 74 y.o. female MRN [id number]  Date of birth: 13-Aug-1946  Subjective No chief complaint on file.   HPI Lisa Phelps is a 75 y.o. female here today for follow up of HTN, HLD, and T2DM.  She reports that overall she is doing well and does not have any new or additional concerns today.   HTN is currently managed with irbesartan/hctz.  She is not experiencing any notable side effects with medication.  BP readings at home have been good.  She denies symptom including chest pain, shortness of breath, palpitations, headache or vision changes.   Diabetes managed with metformin 500mg  bid.  Blood sugars have remained pretty well controlled and a1c is stable at 6.8% today.  She feels that her diet and activity level are pretty good.   HLD is treated with atorvastatin 20mg .  She is tolerating this well without myalgias.    ROS:  A comprehensive ROS was completed and negative except as noted per HPI  Allergies  Allergen Reactions  . Crestor [Rosuvastatin]     cough  . Nsaids   . Pravastatin Other (See Comments)    myalgias    Past Medical History:  Diagnosis Date  . Diabetes (HCC)   . Hyperlipidemia   . Hypertension     Past Surgical History:  Procedure Laterality Date  . ABDOMINAL HYSTERECTOMY  1992  . gallbladder removed  1998    Social History   Socioeconomic History  . Marital status: Married    Spouse name: Not on file  . Number of children: Not on file  . Years of education: Not on file  . Highest education level: Not on file  Occupational History  . Not on file  Tobacco Use  . Smoking status: Former Smoker    Packs/day: 1.00    Years: 40.00    Pack years: 40.00    Quit date: 03/24/2008    Years since quitting: 12.5  . Smokeless tobacco: Never Used  Substance and Sexual Activity  . Alcohol use: Yes  . Drug use: No  . Sexual activity: Never  Other Topics Concern  . Not on file  Social History Narrative  . Not on file   Social Determinants of  Health   Financial Resource Strain:   . Difficulty of Paying Living Expenses: Not on file  Food Insecurity:   . Worried About in the Last Year: Not on file  . Ran Out of Food in the Last Year: Not on file  Transportation Needs:   . Lack of Transportation (Medical): Not on file  . Lack of Transportation (Non-Medical): Not on file  Physical Activity:   . Days of Exercise per Week: Not on file  . Minutes of Exercise per Session: Not on file  Stress:   . Feeling of Stress : Not on file  Social Connections:   . Frequency of Communication with Friends and Family: Not on file  . Frequency of Social Gatherings with Friends and Family: Not on file  . Attends Religious Services: Not on file  . Active Member of Clubs or Organizations: Not on file  . Attends 05/24/2008 Meetings: Not on file  . Marital Status: Not on file    Family History  Problem Relation Age of Onset  . Diabetes Other   . Hyperlipidemia Other   . Hypertension Other   . Diabetes Brother   . Hypertension Brother     Health Maintenance  Topic Date Due  .  OPHTHALMOLOGY EXAM  10/03/2020  . HEMOGLOBIN A1C  04/14/2021  . FOOT EXAM  10/15/2021  . MAMMOGRAM  11/06/2021  . COLONOSCOPY  05/13/2024  . TETANUS/TDAP  07/16/2026  . INFLUENZA VACCINE  Completed  . DEXA SCAN  Completed  . COVID-19 Vaccine  Completed  . Hepatitis C Screening  Completed  . PNA vac Low Risk Adult  Completed     ----------------------------------------------------------------------------------------------------------------------------------------------------------------------------------------------------------------- Physical Exam BP (!) 146/75   Pulse 95   Ht 5' 6.93" (1.7 m)   Wt 198 lb (89.8 kg)   BMI 31.08 kg/m   Physical Exam Constitutional:      Appearance: Normal appearance.  Eyes:     General: No scleral icterus. Cardiovascular:     Rate and Rhythm: Normal rate and regular rhythm.  Pulmonary:      Effort: Pulmonary effort is normal.     Breath sounds: Normal breath sounds.  Musculoskeletal:     Cervical back: Neck supple.  Neurological:     General: No focal deficit present.     Mental Status: She is alert.  Psychiatric:        Mood and Affect: Mood normal.        Behavior: Behavior normal.     ------------------------------------------------------------------------------------------------------------------------------------------------------------------------------------------------------------------- Assessment and Plan  Type 2 diabetes mellitus with diabetic chronic kidney disease (HCC) Most recent A1c of  Lab Results  Component Value Date   HGBA1C 6.8 (A) 10/15/2020   indicates diabetes is well controlled.  She will continue metformin at current strength and dosing.  Counseled on healthy, low carb diet and recommend frequent activity to help with maintaining good control of blood sugars.    Hypertension associated with diabetes (HCC) Blood pressure is mildly elevated today in clinic, readings at home are better. I recommend that she continue current medications and monitoring at home.  In addition they were instructed to follow a low sodium diet with regular exercise to help to maintain adequate control of blood pressure.    Aortic atherosclerosis (HCC) Noted on recent L spine films.   Continue statin, keep BP and diabetes well controlled.    Meds ordered this encounter  Medications  . metFORMIN (GLUCOPHAGE) 500 MG tablet    Sig: Take 1 tab PO BID    Dispense:  180 tablet    Refill:  1    Return in about 6 months (around 04/14/2021) for HTN/DM.    This visit occurred during the SARS-CoV-2 public health emergency.  Safety protocols were in place, including screening questions prior to the visit, additional usage of staff PPE, and extensive cleaning of exam room while observing appropriate contact time as indicated for disinfecting solutions.

## 2020-11-12 ENCOUNTER — Ambulatory Visit: Payer: Medicare HMO

## 2020-11-26 ENCOUNTER — Other Ambulatory Visit: Payer: Self-pay

## 2020-11-26 ENCOUNTER — Ambulatory Visit (INDEPENDENT_AMBULATORY_CARE_PROVIDER_SITE_OTHER): Payer: Medicare HMO

## 2020-11-26 DIAGNOSIS — Z1231 Encounter for screening mammogram for malignant neoplasm of breast: Secondary | ICD-10-CM

## 2020-12-29 ENCOUNTER — Other Ambulatory Visit: Payer: Self-pay | Admitting: Family Medicine

## 2021-01-02 ENCOUNTER — Other Ambulatory Visit: Payer: Self-pay

## 2021-01-02 DIAGNOSIS — E1121 Type 2 diabetes mellitus with diabetic nephropathy: Secondary | ICD-10-CM

## 2021-01-02 DIAGNOSIS — R053 Chronic cough: Secondary | ICD-10-CM

## 2021-01-02 MED ORDER — BD SWAB SINGLE USE REGULAR PADS: MEDICATED_PAD | 3 refills | Status: DC

## 2021-01-02 MED ORDER — TRUEPLUS LANCETS 30G MISC: 3 refills | Status: DC

## 2021-01-02 MED ORDER — GLUCOSE BLOOD VI STRP: 1.0000 | ORAL_STRIP | 5 refills | Status: DC | PRN

## 2021-01-02 MED ORDER — TRUE METRIX GO GLUCOSE METER W/DEVICE KIT: PACK | 0 refills | Status: DC

## 2021-01-02 MED ORDER — BENZONATATE 200 MG PO CAPS: 200.0000 mg | ORAL_CAPSULE | Freq: Three times a day (TID) | ORAL | 1 refills | Status: DC | PRN

## 2021-01-02 MED ORDER — AMBULATORY NON FORMULARY MEDICATION: 11 refills | Status: AC

## 2021-04-13 ENCOUNTER — Other Ambulatory Visit: Payer: Self-pay | Admitting: Family Medicine

## 2021-04-14 ENCOUNTER — Encounter: Payer: Self-pay | Admitting: Family Medicine

## 2021-04-14 ENCOUNTER — Other Ambulatory Visit: Payer: Self-pay

## 2021-04-14 ENCOUNTER — Ambulatory Visit: Payer: Medicare HMO | Admitting: Family Medicine

## 2021-04-14 VITALS — BP 148/86 | HR 87 | Temp 98.3°F | Resp 20 | Ht 66.93 in | Wt 200.0 lb

## 2021-04-14 DIAGNOSIS — I152 Hypertension secondary to endocrine disorders: Secondary | ICD-10-CM

## 2021-04-14 DIAGNOSIS — R454 Irritability and anger: Secondary | ICD-10-CM | POA: Diagnosis not present

## 2021-04-14 DIAGNOSIS — E1122 Type 2 diabetes mellitus with diabetic chronic kidney disease: Secondary | ICD-10-CM | POA: Diagnosis not present

## 2021-04-14 DIAGNOSIS — I700 Atherosclerosis of aorta: Secondary | ICD-10-CM

## 2021-04-14 DIAGNOSIS — N181 Chronic kidney disease, stage 1: Secondary | ICD-10-CM | POA: Diagnosis not present

## 2021-04-14 DIAGNOSIS — E11319 Type 2 diabetes mellitus with unspecified diabetic retinopathy without macular edema: Secondary | ICD-10-CM

## 2021-04-14 DIAGNOSIS — R209 Unspecified disturbances of skin sensation: Secondary | ICD-10-CM | POA: Diagnosis not present

## 2021-04-14 DIAGNOSIS — E1159 Type 2 diabetes mellitus with other circulatory complications: Secondary | ICD-10-CM

## 2021-04-14 DIAGNOSIS — E785 Hyperlipidemia, unspecified: Secondary | ICD-10-CM | POA: Diagnosis not present

## 2021-04-14 MED ORDER — SERTRALINE HCL 50 MG PO TABS: 1.0000 | ORAL_TABLET | Freq: Every day | ORAL | 2 refills | Status: DC

## 2021-04-14 MED ORDER — ATORVASTATIN CALCIUM 20 MG PO TABS: 1.0000 | ORAL_TABLET | Freq: Every day | ORAL | 2 refills | Status: DC

## 2021-04-14 NOTE — Patient Instructions (Signed)
Great to see you today! Have labs completed.  See me again in 6 months or sooner if needed.

## 2021-04-14 NOTE — Assessment & Plan Note (Signed)
Continue statin with good control of lipids and HTN.

## 2021-04-14 NOTE — Assessment & Plan Note (Signed)
Her diabetes has been well controlled historically.  She does have some complications related to her diabetes and we discussed importance of keeping this well controlled to prevent further progression of her co-morbidities. Update a1c today.

## 2021-04-14 NOTE — Assessment & Plan Note (Signed)
BP elevated, improved on recheck. Continue current medication of irbesartan/hctz at current strength.  Update labs today.

## 2021-04-14 NOTE — Assessment & Plan Note (Signed)
Has appt with eye doctor.

## 2021-04-14 NOTE — Assessment & Plan Note (Signed)
Tolerating atorvastatin well.  Update lipid panel and LFT's.

## 2021-04-14 NOTE — Assessment & Plan Note (Signed)
Improved with sertraline, continue.

## 2021-04-14 NOTE — Progress Notes (Signed)
Lisa Phelps - 75 y.o. female MRN [id number]  Date of birth: [birth year]  Subjective Chief Complaint  Patient presents with  . Hypertension    HPI Lisa Phelps is a 75 y.o. female here today for follow up of HTN, DM, and HLD.  She reports that she is doing well today.    She continues to check blood glucose readings at home with numbers in the mid 90's.  She is not taking metformin at this time.  She has had a mild intermittent burning sensation in the top of her R foot.  Not too bothersome at this time.  She does have history of retinopathy as well, she has appt with eye doctor for annual exam.  She is taking atorvastatin for associated HLD as well.   HTN is treated with irbesartan/hctz.  She is tolerating this well and denies side effects.  Good BP readings at home.  A little elevated today on initial check but improved with recheck.  She denies chest pain, shortness of breath, palpitations, headache or vision changes.   Sertraline has been helpful for stress and irritability.  No side effects with this at this time.    ROS:  A comprehensive ROS was completed and negative except as noted per HPI  Allergies  Allergen Reactions  . Crestor [Rosuvastatin]     cough  . Nsaids   . Pravastatin Other (See Comments)    myalgias    Past Medical History:  Diagnosis Date  . Diabetes (HCC)   . Hyperlipidemia   . Hypertension     Past Surgical History:  Procedure Laterality Date  . ABDOMINAL HYSTERECTOMY  1992  . gallbladder removed  1998    Social History   Socioeconomic History  . Marital status: Married    Spouse name: Not on file  . Number of children: Not on file  . Years of education: Not on file  . Highest education level: Not on file  Occupational History  . Not on file  Tobacco Use  . Smoking status: Former Smoker    Packs/day: 1.00    Years: 40.00    Pack years: 40.00    Quit date: 03/24/2008    Years since quitting: 13.0  . Smokeless tobacco: Never Used   Substance and Sexual Activity  . Alcohol use: Yes  . Drug use: No  . Sexual activity: Never  Other Topics Concern  . Not on file  Social History Narrative  . Not on file   Social Determinants of Health   Financial Resource Strain: Not on file  Food Insecurity: Not on file  Transportation Needs: Not on file  Physical Activity: Not on file  Stress: Not on file  Social Connections: Not on file    Family History  Problem Relation Age of Onset  . Diabetes Other   . Hyperlipidemia Other   . Hypertension Other   . Diabetes Brother   . Hypertension Brother     Health Maintenance  Topic Date Due  . COVID-19 Vaccine (2 - Booster for [employer] series) 09/06/2020  . HEMOGLOBIN A1C  04/14/2021  . INFLUENZA VACCINE  07/13/2021  . OPHTHALMOLOGY EXAM  07/13/2021  . FOOT EXAM  10/15/2021  . MAMMOGRAM  11/26/2022  . COLONOSCOPY (Pts 45-59yrs Insurance coverage will need to be confirmed)  05/13/2024  . TETANUS/TDAP  07/16/2026  . DEXA SCAN  Completed  . Hepatitis C Screening  Completed  . PNA vac Low Risk Adult  Completed  . HPV VACCINES  Aged  Out     ----------------------------------------------------------------------------------------------------------------------------------------------------------------------------------------------------------------- Physical Exam BP (!) 148/86 (BP Location: Left Arm, Patient Position: Sitting, Cuff Size: Large)   Pulse 87   Temp 98.3 F (36.8 C) (Oral)   Resp 20   Ht 5' 6.93" (1.7 m)   Wt 200 lb (90.7 kg)   SpO2 97%   BMI 31.39 kg/m   Physical Exam Constitutional:      Appearance: Normal appearance.  HENT:     Head: Normocephalic and atraumatic.  Eyes:     General: No scleral icterus. Cardiovascular:     Rate and Rhythm: Normal rate and regular rhythm.     Pulses: Normal pulses.  Pulmonary:     Effort: Pulmonary effort is normal.     Breath sounds: Normal breath sounds.  Musculoskeletal:     Cervical back: Neck supple.   Neurological:     General: No focal deficit present.     Mental Status: She is alert.  Psychiatric:        Mood and Affect: Mood normal.        Behavior: Behavior normal.     ------------------------------------------------------------------------------------------------------------------------------------------------------------------------------------------------------------------- Assessment and Plan  Hypertension associated with diabetes (HCC) BP elevated, improved on recheck. Continue current medication of irbesartan/hctz at current strength.  Update labs today.   Aortic atherosclerosis (HCC) Continue statin with good control of lipids and HTN.   Type 2 diabetes mellitus with diabetic chronic kidney disease (HCC) Her diabetes has been well controlled historically.  She does have some complications related to her diabetes and we discussed importance of keeping this well controlled to prevent further progression of her co-morbidities. Update a1c today.    Diabetic retinopathy Has appt with eye doctor.    Hyperlipidemia LDL goal <100 Tolerating atorvastatin well.  Update lipid panel and LFT's.    Irritability Improved with sertraline, continue.     Meds ordered this encounter  Medications  . atorvastatin (LIPITOR) 20 MG tablet    Sig: Take 1 tablet (20 mg total) by mouth daily.    Dispense:  90 tablet    Refill:  2  . sertraline (ZOLOFT) 50 MG tablet    Sig: Take 1 tablet (50 mg total) by mouth daily.    Dispense:  90 tablet    Refill:  2    Return in about 6 months (around 10/15/2021) for HTN/DM.    This visit occurred during the SARS-CoV-2 public health emergency.  Safety protocols were in place, including screening questions prior to the visit, additional usage of staff PPE, and extensive cleaning of exam room while observing appropriate contact time as indicated for disinfecting solutions.

## 2021-04-15 LAB — COMPLETE METABOLIC PANEL WITH GFR
AG Ratio: 1.7 (calc) (ref 1.0–2.5)
ALT: 16 U/L (ref 6–29)
AST: 21 U/L (ref 10–35)
Albumin: 4.3 g/dL (ref 3.6–5.1)
Alkaline phosphatase (APISO): 78 U/L (ref 37–153)
BUN: 12 mg/dL (ref 7–25)
CO2: 28 mmol/L (ref 20–32)
Calcium: 9.6 mg/dL (ref 8.6–10.4)
Chloride: 102 mmol/L (ref 98–110)
Creat: 0.88 mg/dL (ref 0.60–0.93)
GFR, Est African American: 75 mL/min/{1.73_m2} (ref >=60)
GFR, Est Non African American: 65 mL/min/{1.73_m2} (ref >=60)
Globulin: 2.6 g/dL (calc) (ref 1.9–3.7)
Glucose, Bld: 146 mg/dL — ABNORMAL HIGH (ref 65–139)
Potassium: 4.2 mmol/L (ref 3.5–5.3)
Sodium: 138 mmol/L (ref 135–146)
Total Bilirubin: 0.3 mg/dL (ref 0.2–1.2)
Total Protein: 6.9 g/dL (ref 6.1–8.1)

## 2021-04-15 LAB — HEMOGLOBIN A1C
Hgb A1c MFr Bld: 6.9 % of total Hgb — ABNORMAL HIGH (ref <5.7)
Mean Plasma Glucose: 151 mg/dL
eAG (mmol/L): 8.4 mmol/L

## 2021-04-15 LAB — CBC WITH DIFFERENTIAL/PLATELET
Absolute Monocytes: 463 cells/uL (ref 200–950)
Basophils Absolute: 21 cells/uL (ref 0–200)
Basophils Relative: 0.4 %
Eosinophils Absolute: 83 cells/uL (ref 15–500)
Eosinophils Relative: 1.6 %
HCT: 44.9 % (ref 35.0–45.0)
Hemoglobin: 14.5 g/dL (ref 11.7–15.5)
Lymphs Abs: 2189 cells/uL (ref 850–3900)
MCH: 30.1 pg (ref 27.0–33.0)
MCHC: 32.3 g/dL (ref 32.0–36.0)
MCV: 93.3 fL (ref 80.0–100.0)
MPV: 9.6 fL (ref 7.5–12.5)
Monocytes Relative: 8.9 %
Neutro Abs: 2444 cells/uL (ref 1500–7800)
Neutrophils Relative %: 47 %
Platelets: 338 10*3/uL (ref 140–400)
RBC: 4.81 10*6/uL (ref 3.80–5.10)
RDW: 12.8 % (ref 11.0–15.0)
Total Lymphocyte: 42.1 %
WBC: 5.2 10*3/uL (ref 3.8–10.8)

## 2021-04-15 LAB — LIPID PANEL W/REFLEX DIRECT LDL
Cholesterol: 150 mg/dL (ref <200)
HDL: 45 mg/dL — ABNORMAL LOW (ref >=50)
LDL Cholesterol (Calc): 88 mg/dL (calc)
Non-HDL Cholesterol (Calc): 105 mg/dL (calc) (ref <130)
Total CHOL/HDL Ratio: 3.3 (calc) (ref <5.0)
Triglycerides: 81 mg/dL (ref <150)

## 2021-04-15 LAB — VITAMIN B12: Vitamin B-12: 432 pg/mL (ref 200–1100)

## 2021-06-10 ENCOUNTER — Other Ambulatory Visit: Payer: Self-pay | Admitting: Sports Medicine

## 2021-06-10 DIAGNOSIS — R053 Chronic cough: Secondary | ICD-10-CM

## 2021-06-10 DIAGNOSIS — E1121 Type 2 diabetes mellitus with diabetic nephropathy: Secondary | ICD-10-CM

## 2021-06-10 NOTE — Telephone Encounter (Signed)
To PCP

## 2021-06-16 ENCOUNTER — Telehealth: Payer: Self-pay | Admitting: Family Medicine

## 2021-06-16 NOTE — Progress Notes (Signed)
  Chronic Care Management   Outreach Note  06/16/2021 Name: Onna Nodal MRN: [id number] DOB: 1946-08-09  Referred by: Everrett Coombe, DO Reason for referral : No chief complaint on file.   An unsuccessful telephone outreach was attempted today. The patient was referred to the pharmacist for assistance with care management and care coordination.   Follow Up Plan:   Carmell [place] Upstream Scheduler

## 2021-06-19 ENCOUNTER — Other Ambulatory Visit: Payer: Self-pay | Admitting: Family Medicine

## 2021-06-19 DIAGNOSIS — R053 Chronic cough: Secondary | ICD-10-CM

## 2021-06-19 MED ORDER — BENZONATATE 200 MG PO CAPS: ORAL_CAPSULE | ORAL | 1 refills | Status: DC

## 2021-06-23 ENCOUNTER — Telehealth: Payer: Self-pay | Admitting: Family Medicine

## 2021-06-23 NOTE — Chronic Care Management (AMB) (Signed)
  Chronic Care Management   Outreach Note  06/23/2021 Name: Lisa Phelps MRN: [id number] DOB: [birth year]  Referred by: Everrett Coombe, DO Reason for referral : No chief complaint on file.   A second unsuccessful telephone outreach was attempted today. The patient was referred to pharmacist for assistance with care management and care coordination.  Follow Up Plan:   Carmell [place] Upstream Scheduler

## 2021-07-06 ENCOUNTER — Telehealth: Payer: Self-pay | Admitting: Family Medicine

## 2021-07-06 NOTE — Chronic Care Management (AMB) (Signed)
  Chronic Care Management   Outreach Note  07/06/2021 Name: Lisa Phelps MRN: [id number] DOB: Sep 09, 1946  Referred by: Everrett Coombe, DO Reason for referral : No chief complaint on file.   Third unsuccessful telephone outreach was attempted today. The patient was referred to the pharmacist for assistance with care management and care coordination.   Follow Up Plan:   Carmell [place] Upstream Scheduler

## 2021-07-23 DIAGNOSIS — H02831 Dermatochalasis of right upper eyelid: Secondary | ICD-10-CM | POA: Diagnosis not present

## 2021-07-23 DIAGNOSIS — H02834 Dermatochalasis of left upper eyelid: Secondary | ICD-10-CM | POA: Diagnosis not present

## 2021-07-23 DIAGNOSIS — H35033 Hypertensive retinopathy, bilateral: Secondary | ICD-10-CM | POA: Diagnosis not present

## 2021-07-23 DIAGNOSIS — H40003 Preglaucoma, unspecified, bilateral: Secondary | ICD-10-CM | POA: Diagnosis not present

## 2021-07-23 DIAGNOSIS — H527 Unspecified disorder of refraction: Secondary | ICD-10-CM | POA: Diagnosis not present

## 2021-07-23 DIAGNOSIS — E113292 Type 2 diabetes mellitus with mild nonproliferative diabetic retinopathy without macular edema, left eye: Secondary | ICD-10-CM | POA: Diagnosis not present

## 2021-07-23 DIAGNOSIS — H25813 Combined forms of age-related cataract, bilateral: Secondary | ICD-10-CM | POA: Diagnosis not present

## 2021-07-23 LAB — HM DIABETES EYE EXAM

## 2021-10-15 ENCOUNTER — Ambulatory Visit: Payer: Medicare HMO | Admitting: Family Medicine

## 2021-10-19 ENCOUNTER — Other Ambulatory Visit: Payer: Self-pay | Admitting: Family Medicine

## 2021-10-22 ENCOUNTER — Other Ambulatory Visit: Payer: Self-pay | Admitting: Family Medicine

## 2021-10-22 DIAGNOSIS — Z1231 Encounter for screening mammogram for malignant neoplasm of breast: Secondary | ICD-10-CM

## 2021-10-29 ENCOUNTER — Encounter: Payer: Self-pay | Admitting: Family Medicine

## 2021-10-29 ENCOUNTER — Ambulatory Visit (INDEPENDENT_AMBULATORY_CARE_PROVIDER_SITE_OTHER): Payer: Medicare HMO | Admitting: Family Medicine

## 2021-10-29 ENCOUNTER — Other Ambulatory Visit: Payer: Self-pay

## 2021-10-29 VITALS — BP 134/84 | HR 105 | Temp 97.4°F | Ht 66.93 in | Wt 197.7 lb

## 2021-10-29 DIAGNOSIS — E785 Hyperlipidemia, unspecified: Secondary | ICD-10-CM | POA: Diagnosis not present

## 2021-10-29 DIAGNOSIS — E1122 Type 2 diabetes mellitus with diabetic chronic kidney disease: Secondary | ICD-10-CM

## 2021-10-29 DIAGNOSIS — I152 Hypertension secondary to endocrine disorders: Secondary | ICD-10-CM

## 2021-10-29 DIAGNOSIS — I700 Atherosclerosis of aorta: Secondary | ICD-10-CM | POA: Diagnosis not present

## 2021-10-29 DIAGNOSIS — N181 Chronic kidney disease, stage 1: Secondary | ICD-10-CM

## 2021-10-29 DIAGNOSIS — E1159 Type 2 diabetes mellitus with other circulatory complications: Secondary | ICD-10-CM | POA: Diagnosis not present

## 2021-10-29 DIAGNOSIS — R454 Irritability and anger: Secondary | ICD-10-CM

## 2021-10-29 LAB — POCT GLYCOSYLATED HEMOGLOBIN (HGB A1C): HbA1c, POC (controlled diabetic range): 6.8 % (ref 0.0–7.0)

## 2021-10-29 NOTE — Assessment & Plan Note (Signed)
Blood pressure remains well controlled.  Continue current medications. 

## 2021-10-29 NOTE — Progress Notes (Signed)
Lisa Phelps - 75 y.o. female MRN [id number]  Date of birth: 1946/11/02  Subjective No chief complaint on file.   HPI Lisa Phelps is a 75 year old female here today for follow-up visit.  She reports she is doing well.  She has no new complaints today.  Blood sugars remain well controlled with metformin.  She has no problems with tolerating this.  She does get some occasional tingling in her toes but overall she has good sensation.  She continues on atorvastatin for associated hyperlipidemia.  Blood pressures have remained well controlled with irbesartan/hydrochlorothiazide.  She denies chest pain, shortness of breath, palpitations, headache or vision changes.  She continues on sertraline for anxiety.  She is not sure if this is been helpful but would like to continue.  ROS:  A comprehensive ROS was completed and negative except as noted per HPI  Allergies  Allergen Reactions   Crestor [Rosuvastatin]     cough   Nsaids    Pravastatin Other (See Comments)    myalgias    Past Medical History:  Diagnosis Date   Diabetes (HCC)    Hyperlipidemia    Hypertension     Past Surgical History:  Procedure Laterality Date   ABDOMINAL HYSTERECTOMY  1992   gallbladder removed  1998    Social History   Socioeconomic History   Marital status: Married    Spouse name: Not on file   Number of children: Not on file   Years of education: Not on file   Highest education level: Not on file  Occupational History   Not on file  Tobacco Use   Smoking status: Former    Packs/day: 1.00    Years: 40.00    Pack years: 40.00    Types: Cigarettes    Quit date: 03/24/2008    Years since quitting: 13.6   Smokeless tobacco: Never  Substance and Sexual Activity   Alcohol use: Yes   Drug use: No   Sexual activity: Never  Other Topics Concern   Not on file  Social History Narrative   Not on file   Social Determinants of Health   Financial Resource Strain: Not on file  Food Insecurity: Not on  file  Transportation Needs: Not on file  Physical Activity: Not on file  Stress: Not on file  Social Connections: Not on file    Family History  Problem Relation Age of Onset   Diabetes Other    Hyperlipidemia Other    Hypertension Other    Diabetes Brother    Hypertension Brother     Health Maintenance  Topic Date Due   Zoster Vaccines- Shingrix (1 of 2) Never done   FOOT EXAM  10/15/2021   HEMOGLOBIN A1C  04/28/2022   OPHTHALMOLOGY EXAM  07/23/2022   MAMMOGRAM  11/26/2022   COLONOSCOPY (Pts 45-80yrs Insurance coverage will need to be confirmed)  05/13/2024   TETANUS/TDAP  07/16/2026   Pneumonia Vaccine 21+ Years old  Completed   INFLUENZA VACCINE  Completed   DEXA SCAN  Completed   COVID-19 Vaccine  Completed   Hepatitis C Screening  Completed   HPV VACCINES  Aged Out     ----------------------------------------------------------------------------------------------------------------------------------------------------------------------------------------------------------------- Physical Exam BP 134/84 (BP Location: Left Arm, Patient Position: Sitting, Cuff Size: Normal)   Pulse (!) 105   Temp (!) 97.4 F (36.3 C)   Ht 5' 6.93" (1.7 m)   Wt 197 lb 11.2 oz (89.7 kg)   SpO2 95%   BMI 31.03 kg/m   Physical Exam  Constitutional:      Appearance: Normal appearance.  HENT:     Head: Normocephalic and atraumatic.  Eyes:     General: No scleral icterus. Cardiovascular:     Rate and Rhythm: Normal rate and regular rhythm.  Pulmonary:     Effort: Pulmonary effort is normal.     Breath sounds: Normal breath sounds.  Musculoskeletal:     Cervical back: Neck supple.  Neurological:     General: No focal deficit present.     Mental Status: She is alert.  Psychiatric:        Mood and Affect: Mood normal.        Behavior: Behavior normal.     ------------------------------------------------------------------------------------------------------------------------------------------------------------------------------------------------------------------- Assessment and Plan  Type 2 diabetes mellitus with diabetic chronic kidney disease (HCC) Blood sugars remain fairly well controlled.  Continue metformin at current strength.  Low carbohydrate diet encouraged.  Hypertension associated with diabetes (HCC) Blood pressure remains well controlled.  Continue current medications.  Aortic atherosclerosis (HCC) Continue atorvastatin.  Hyperlipidemia LDL goal <100 Continue atorvastatin current strength.  Irritability Slight improvement with sertraline.  She will continue her current strength.   No orders of the defined types were placed in this encounter.   Return in about 6 months (around 04/28/2022) for T2DM.    This visit occurred during the SARS-CoV-2 public health emergency.  Safety protocols were in place, including screening questions prior to the visit, additional usage of staff PPE, and extensive cleaning of exam room while observing appropriate contact time as indicated for disinfecting solutions.

## 2021-10-29 NOTE — Assessment & Plan Note (Signed)
Blood sugars remain fairly well controlled.  Continue metformin at current strength.  Low carbohydrate diet encouraged.

## 2021-10-29 NOTE — Assessment & Plan Note (Signed)
Continue atorvastatin current strength.

## 2021-10-29 NOTE — Assessment & Plan Note (Signed)
Continue atorvastatin

## 2021-10-29 NOTE — Assessment & Plan Note (Signed)
Slight improvement with sertraline.  She will continue her current strength.

## 2021-12-02 ENCOUNTER — Other Ambulatory Visit: Payer: Self-pay

## 2021-12-02 ENCOUNTER — Ambulatory Visit (INDEPENDENT_AMBULATORY_CARE_PROVIDER_SITE_OTHER): Payer: Medicare HMO

## 2021-12-02 DIAGNOSIS — Z1231 Encounter for screening mammogram for malignant neoplasm of breast: Secondary | ICD-10-CM

## 2021-12-30 ENCOUNTER — Other Ambulatory Visit: Payer: Self-pay | Admitting: Family Medicine

## 2022-02-09 ENCOUNTER — Other Ambulatory Visit: Payer: Self-pay | Admitting: Family Medicine

## 2022-02-09 DIAGNOSIS — R053 Chronic cough: Secondary | ICD-10-CM

## 2022-02-14 IMAGING — MG MM DIGITAL SCREENING BILAT W/ TOMO AND CAD
8 series · 8 of 24 positions shown · non-contrast
Comparison: Previous exam(s).

CLINICAL DATA: Screening.

EXAM:
DIGITAL SCREENING BILATERAL MAMMOGRAM WITH TOMOSYNTHESIS AND CAD
TECHNIQUE: Bilateral screening digital craniocaudal and mediolateral oblique
mammograms were obtained. Bilateral screening digital breast
tomosynthesis was performed. The images were evaluated with
computer-aided detection.

[R CC synth-2D]
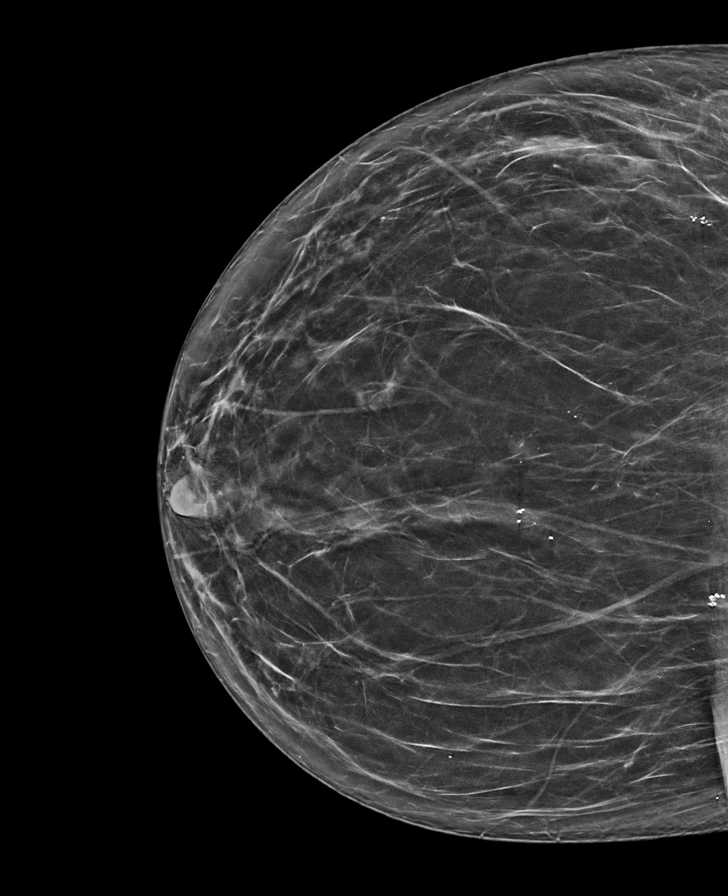

[L CC synth-2D]
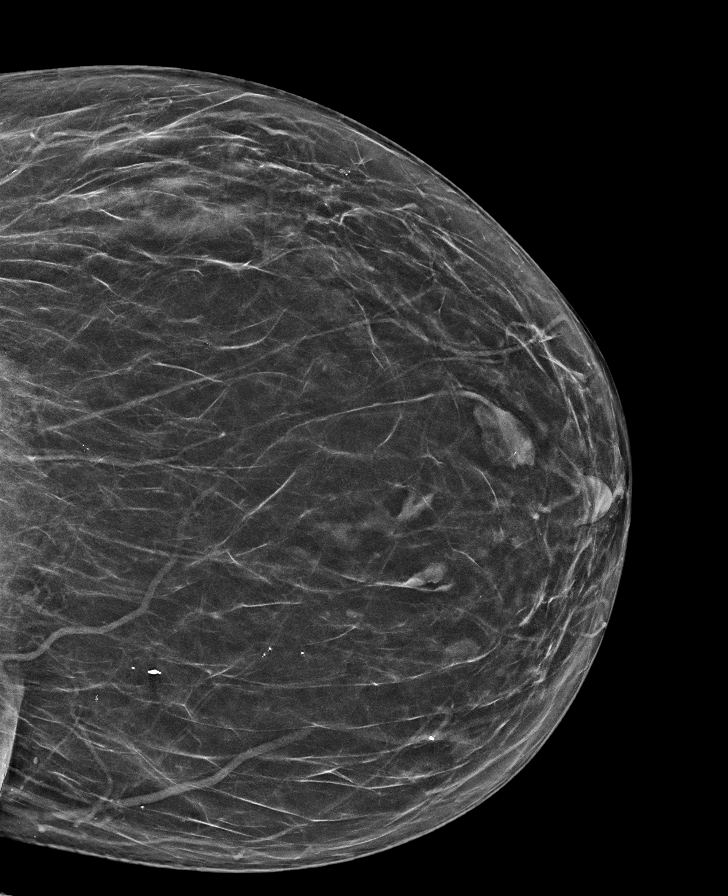

[L MLO synth-2D]
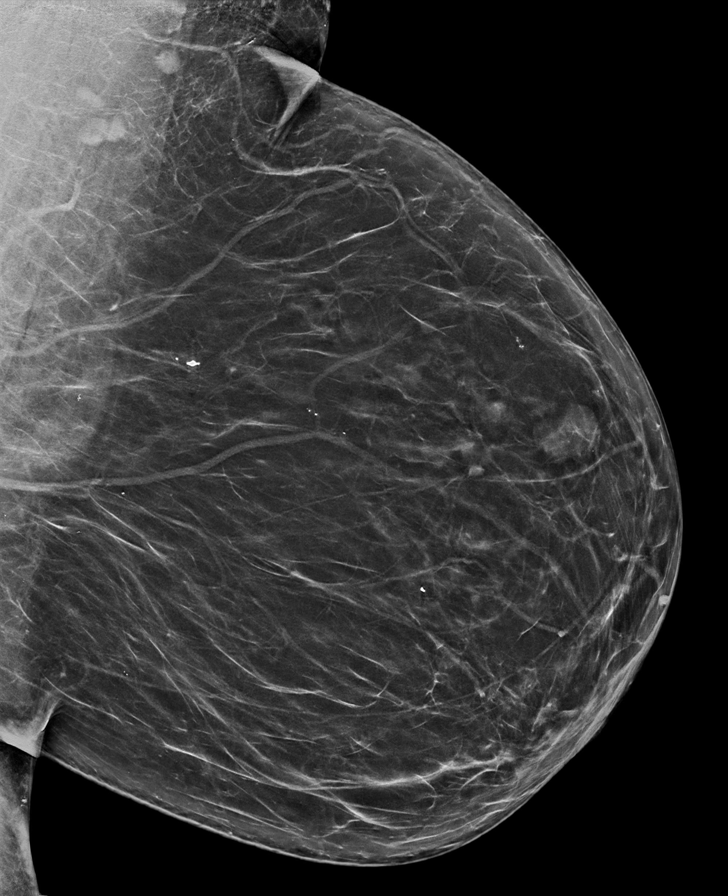

[R MLO synth-2D]
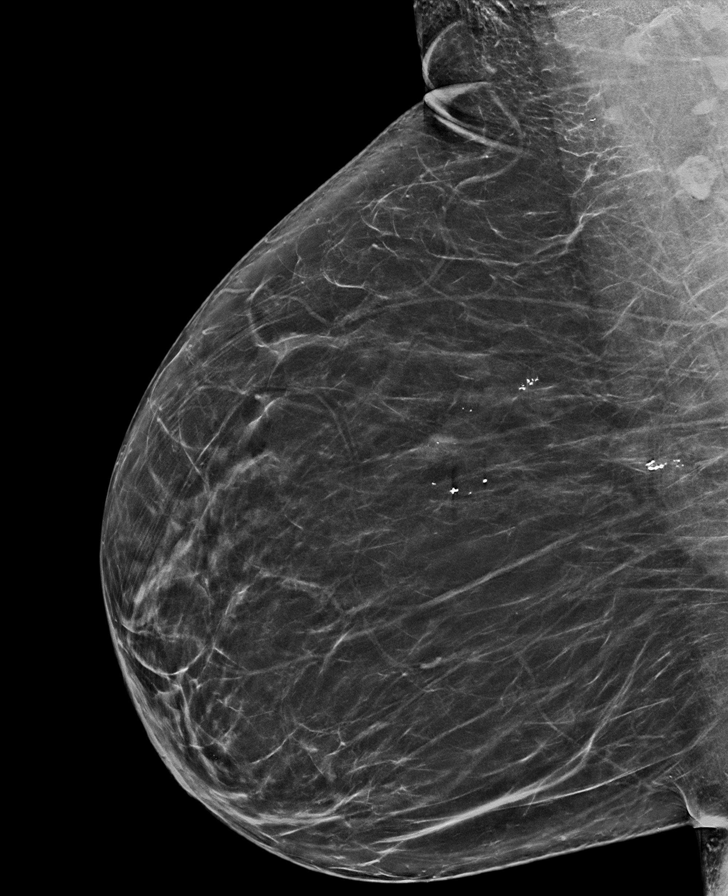

[L CC tomo · tomo slice 35/69.0]
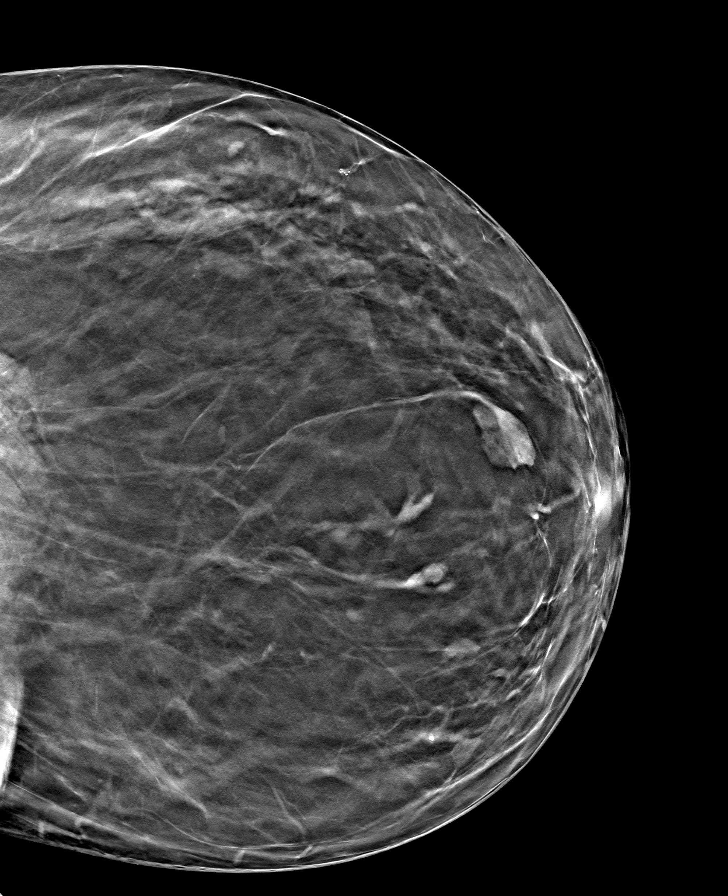

[R CC tomo · tomo slice 35/68.0]
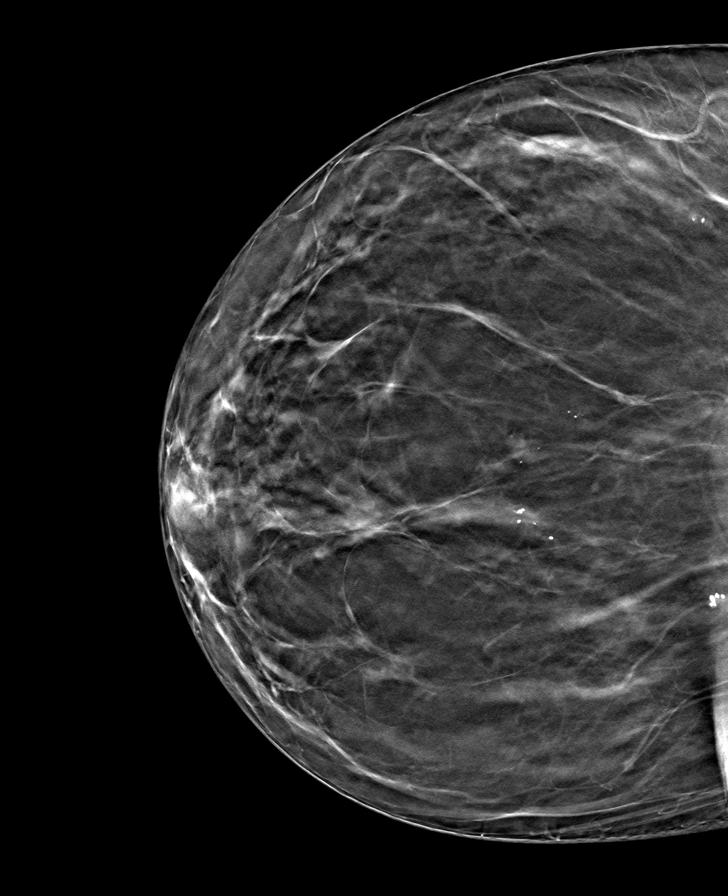

[L MLO tomo · tomo slice 41/80.0]
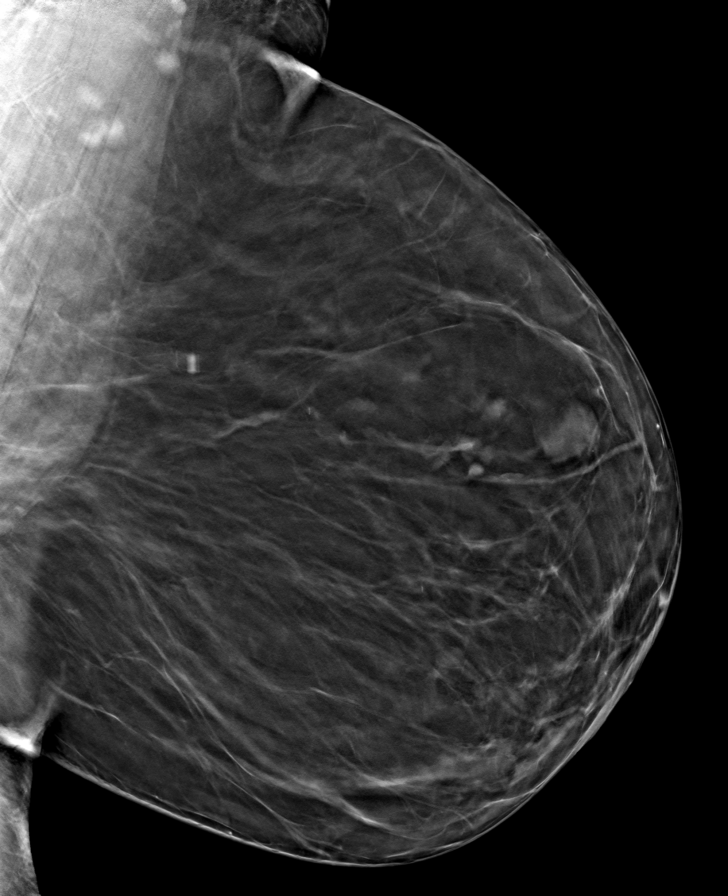

[R MLO tomo · tomo slice 40/79.0]
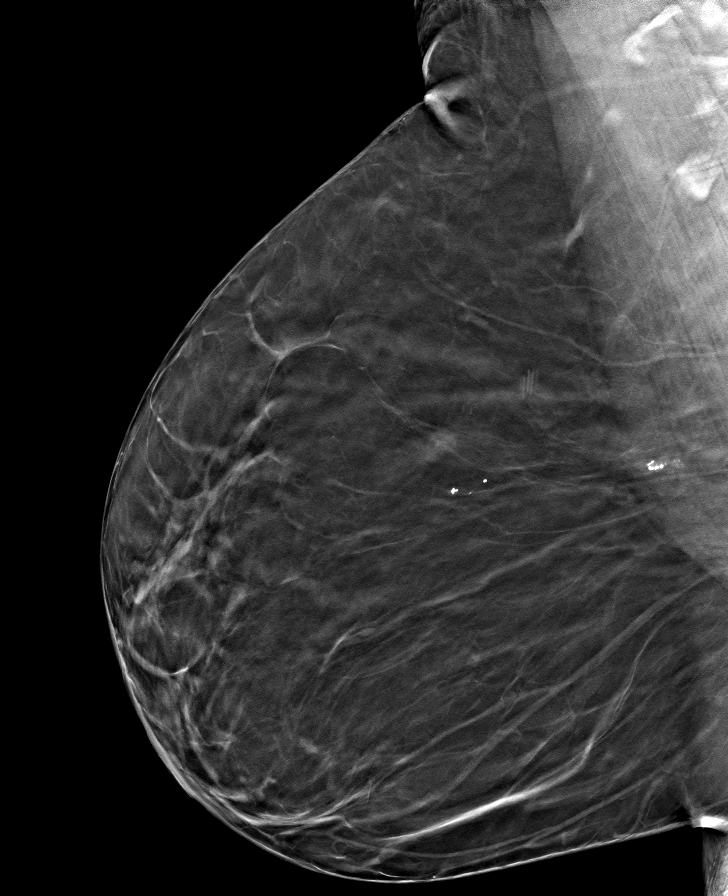

[8 of 24 positions shown; findings below may reference images not displayed]

ACR Breast Density Category b: There are scattered areas of
fibroglandular density.
FINDINGS: There are no findings suspicious for malignancy.
IMPRESSION: No mammographic evidence of malignancy. A result letter of this
screening mammogram will be mailed directly to the patient.

RECOMMENDATION:
Screening mammogram in one year. (Code:51-O-LD2)

BI-RADS CATEGORY  1: Negative.

## 2022-04-28 ENCOUNTER — Ambulatory Visit (INDEPENDENT_AMBULATORY_CARE_PROVIDER_SITE_OTHER): Payer: Medicare HMO | Admitting: Family Medicine

## 2022-04-28 ENCOUNTER — Encounter: Payer: Self-pay | Admitting: Family Medicine

## 2022-04-28 VITALS — BP 136/83 | HR 78 | Ht 66.93 in | Wt 196.0 lb

## 2022-04-28 DIAGNOSIS — N181 Chronic kidney disease, stage 1: Secondary | ICD-10-CM | POA: Diagnosis not present

## 2022-04-28 DIAGNOSIS — E1122 Type 2 diabetes mellitus with diabetic chronic kidney disease: Secondary | ICD-10-CM | POA: Diagnosis not present

## 2022-04-28 DIAGNOSIS — E559 Vitamin D deficiency, unspecified: Secondary | ICD-10-CM

## 2022-04-28 DIAGNOSIS — F419 Anxiety disorder, unspecified: Secondary | ICD-10-CM | POA: Insufficient documentation

## 2022-04-28 DIAGNOSIS — E1159 Type 2 diabetes mellitus with other circulatory complications: Secondary | ICD-10-CM | POA: Diagnosis not present

## 2022-04-28 DIAGNOSIS — I152 Hypertension secondary to endocrine disorders: Secondary | ICD-10-CM | POA: Diagnosis not present

## 2022-04-28 DIAGNOSIS — R454 Irritability and anger: Secondary | ICD-10-CM | POA: Diagnosis not present

## 2022-04-28 DIAGNOSIS — E785 Hyperlipidemia, unspecified: Secondary | ICD-10-CM

## 2022-04-28 MED ORDER — SERTRALINE HCL 100 MG PO TABS: 100.0000 mg | ORAL_TABLET | Freq: Every day | ORAL | 2 refills | Status: DC

## 2022-04-28 NOTE — Progress Notes (Signed)
?Lisa Phelps - 76 y.o. female MRN [id number]  Date of birth: Apr 21, 1946 ? ?Subjective ?Chief Complaint  ?Patient presents with  ? Diabetes  ? ? ?HPI ?Lisa Phelps is a 76 y.o. female here today for follow up visit.    ? ?She is having some difficulty sleeping.  Feels like it may be due to he anxiety.  She is able to initially fall asleep but wakes up after a couple of hours she wakes up and has difficulty falling back asleep.  She feels like she is unable to turn the thoughts off in her head. She has tried melatonin but had nightmares with this.  ? ?She continues on irbesartan and hctz for management of HTN.  Doing well with this without side effects.  She denies chest pain, shortness of breath, palpitations, headache or vision changes.  ? ?Her diabetes has been well controlled with diet and metformin.  Tolerating metformin well.  She is tolerating atorvastatin as well for management of HLD.   ? ?ROS:  A comprehensive ROS was completed and negative except as noted per HPI ? ?Allergies  ?Allergen Reactions  ? Crestor [Rosuvastatin]   ?  cough  ? Nsaids   ? Pravastatin Other (See Comments)  ?  myalgias  ? ? ?Past Medical History:  ?Diagnosis Date  ? Diabetes (HCC)   ? Hyperlipidemia   ? Hypertension   ? ? ?Past Surgical History:  ?Procedure Laterality Date  ? ABDOMINAL HYSTERECTOMY  1992  ? gallbladder removed  1998  ? ? ?Social History  ? ?Socioeconomic History  ? Marital status: Married  ?  Spouse name: Not on file  ? Number of children: Not on file  ? Years of education: Not on file  ? Highest education level: Not on file  ?Occupational History  ? Not on file  ?Tobacco Use  ? Smoking status: Former  ?  Packs/day: 1.00  ?  Years: 40.00  ?  Pack years: 40.00  ?  Types: Cigarettes  ?  Quit date: 03/24/2008  ?  Years since quitting: 14.1  ? Smokeless tobacco: Never  ?Substance and Sexual Activity  ? Alcohol use: Yes  ? Drug use: No  ? Sexual activity: Never  ?Other Topics Concern  ? Not on file  ?Social History  Narrative  ? Not on file  ? ?Social Determinants of Health  ? ?Financial Resource Strain: Not on file  ?Food Insecurity: Not on file  ?Transportation Needs: Not on file  ?Physical Activity: Not on file  ?Stress: Not on file  ?Social Connections: Not on file  ? ? ?Family History  ?Problem Relation Age of Onset  ? Diabetes Other   ? Hyperlipidemia Other   ? Hypertension Other   ? Diabetes Brother   ? Hypertension Brother   ? ? ?Health Maintenance  ?Topic Date Due  ? Zoster Vaccines- Shingrix (1 of 2) Never done  ? HEMOGLOBIN A1C  04/28/2022  ? INFLUENZA VACCINE  07/13/2022  ? OPHTHALMOLOGY EXAM  07/23/2022  ? FOOT EXAM  04/29/2023  ? COLONOSCOPY (Pts 45-38yrs Insurance coverage will need to be confirmed)  05/13/2024  ? TETANUS/TDAP  07/16/2026  ? Pneumonia Vaccine 77+ Years old  Completed  ? DEXA SCAN  Completed  ? COVID-19 Vaccine  Completed  ? Hepatitis C Screening  Completed  ? HPV VACCINES  Aged Out  ? ? ? ?----------------------------------------------------------------------------------------------------------------------------------------------------------------------------------------------------------------- ?Physical Exam ?BP 136/83 (BP Location: Left Arm, Patient Position: Sitting, Cuff Size: Normal)   Pulse 78  Ht 5' 6.93" (1.7 m)   Wt 196 lb (88.9 kg)   SpO2 96%   BMI 30.76 kg/m?  ? ?Physical Exam ?Constitutional:   ?   Appearance: Normal appearance.  ?Eyes:  ?   General: No scleral icterus. ?Musculoskeletal:  ?   Cervical back: Neck supple.  ?Neurological:  ?   Mental Status: She is alert.  ?Psychiatric:     ?   Mood and Affect: Mood normal.     ?   Behavior: Behavior normal.  ? ? ?------------------------------------------------------------------------------------------------------------------------------------------------------------------------------------------------------------------- ?Assessment and Plan ? ?Anxiety ?Having increase anxiety with insomnia.  Increasing sertraline to 100mg  daily.   She does not want to try prescription sleep aids at this time.  She can consider valerian root to help with sleep if needed.  ? ?Type 2 diabetes mellitus with diabetic chronic kidney disease ([place]) ?Diabetes has been fairly well controlled.  Updating A1c today.  ? ?Hypertension associated with diabetes ([place]) ?BP stable at this time.  Continue Avalide at current strength.   ? ?Hyperlipidemia LDL goal <100 ?Updating lipid panel. We'll plan to continue atorvastatin at current strength.   ? ? ?Meds ordered this encounter  ?Medications  ? sertraline (ZOLOFT) 100 MG tablet  ?  Sig: Take 1 tablet (100 mg total) by mouth daily.  ?  Dispense:  90 tablet  ?  Refill:  2  ? ? ?Return in about 6 months (around 10/29/2022) for HTN/T2DM. ? ? ? ?This visit occurred during the SARS-CoV-2 public health emergency.  Safety protocols were in place, including screening questions prior to the visit, additional usage of staff PPE, and extensive cleaning of exam room while observing appropriate contact time as indicated for disinfecting solutions.  ? ?

## 2022-04-28 NOTE — Assessment & Plan Note (Signed)
Updating lipid panel. We'll plan to continue atorvastatin at current strength.   ?

## 2022-04-28 NOTE — Assessment & Plan Note (Signed)
Having increase anxiety with insomnia.  Increasing sertraline to 100mg  daily.  She does not want to try prescription sleep aids at this time.  She can consider valerian root to help with sleep if needed.  ?

## 2022-04-28 NOTE — Assessment & Plan Note (Signed)
Diabetes has been fairly well controlled.  Updating A1c today.  ?

## 2022-04-28 NOTE — Assessment & Plan Note (Signed)
BP stable at this time.  Continue Avalide at current strength.   ?

## 2022-04-28 NOTE — Patient Instructions (Addendum)
Let's try increasing your sertraline to 100mg  daily.  ?You may try valerian root for sleep.  ?We'll be in touch with lab results.  ?

## 2022-04-29 LAB — HEMOGLOBIN A1C
Hgb A1c MFr Bld: 6.8 % of total Hgb — ABNORMAL HIGH (ref <5.7)
Mean Plasma Glucose: 148 mg/dL
eAG (mmol/L): 8.2 mmol/L

## 2022-04-29 LAB — CBC WITH DIFFERENTIAL/PLATELET
Absolute Monocytes: 498 cells/uL (ref 200–950)
Basophils Absolute: 28 cells/uL (ref 0–200)
Basophils Relative: 0.5 %
Eosinophils Absolute: 140 cells/uL (ref 15–500)
Eosinophils Relative: 2.5 %
HCT: 41.6 % (ref 35.0–45.0)
Hemoglobin: 13.4 g/dL (ref 11.7–15.5)
Lymphs Abs: 2223 cells/uL (ref 850–3900)
MCH: 30.5 pg (ref 27.0–33.0)
MCHC: 32.2 g/dL (ref 32.0–36.0)
MCV: 94.8 fL (ref 80.0–100.0)
MPV: 9.7 fL (ref 7.5–12.5)
Monocytes Relative: 8.9 %
Neutro Abs: 2710 cells/uL (ref 1500–7800)
Neutrophils Relative %: 48.4 %
Platelets: 318 10*3/uL (ref 140–400)
RBC: 4.39 10*6/uL (ref 3.80–5.10)
RDW: 13.6 % (ref 11.0–15.0)
Total Lymphocyte: 39.7 %
WBC: 5.6 10*3/uL (ref 3.8–10.8)

## 2022-04-29 LAB — COMPLETE METABOLIC PANEL WITH GFR
AG Ratio: 1.6 (calc) (ref 1.0–2.5)
ALT: 23 U/L (ref 6–29)
AST: 25 U/L (ref 10–35)
Albumin: 4.1 g/dL (ref 3.6–5.1)
Alkaline phosphatase (APISO): 74 U/L (ref 37–153)
BUN: 9 mg/dL (ref 7–25)
CO2: 27 mmol/L (ref 20–32)
Calcium: 9.5 mg/dL (ref 8.6–10.4)
Chloride: 102 mmol/L (ref 98–110)
Creat: 0.77 mg/dL (ref 0.60–1.00)
Globulin: 2.6 g/dL (calc) (ref 1.9–3.7)
Glucose, Bld: 133 mg/dL — ABNORMAL HIGH (ref 65–99)
Potassium: 4 mmol/L (ref 3.5–5.3)
Sodium: 140 mmol/L (ref 135–146)
Total Bilirubin: 0.3 mg/dL (ref 0.2–1.2)
Total Protein: 6.7 g/dL (ref 6.1–8.1)
eGFR: 80 mL/min/{1.73_m2} (ref >=60)

## 2022-04-29 LAB — LIPID PANEL W/REFLEX DIRECT LDL
Cholesterol: 132 mg/dL (ref <200)
HDL: 42 mg/dL — ABNORMAL LOW (ref >=50)
LDL Cholesterol (Calc): 74 mg/dL (calc)
Non-HDL Cholesterol (Calc): 90 mg/dL (calc) (ref <130)
Total CHOL/HDL Ratio: 3.1 (calc) (ref <5.0)
Triglycerides: 79 mg/dL (ref <150)

## 2022-04-29 LAB — VITAMIN D 25 HYDROXY (VIT D DEFICIENCY, FRACTURES): Vit D, 25-Hydroxy: 73 ng/mL (ref 30–100)

## 2022-05-11 ENCOUNTER — Other Ambulatory Visit: Payer: Self-pay | Admitting: Family Medicine

## 2022-06-04 ENCOUNTER — Other Ambulatory Visit: Payer: Self-pay | Admitting: Family Medicine

## 2022-07-24 ENCOUNTER — Other Ambulatory Visit: Payer: Self-pay | Admitting: Family Medicine

## 2022-07-24 DIAGNOSIS — R053 Chronic cough: Secondary | ICD-10-CM

## 2022-08-03 DIAGNOSIS — H40003 Preglaucoma, unspecified, bilateral: Secondary | ICD-10-CM | POA: Diagnosis not present

## 2022-08-03 DIAGNOSIS — E113292 Type 2 diabetes mellitus with mild nonproliferative diabetic retinopathy without macular edema, left eye: Secondary | ICD-10-CM | POA: Diagnosis not present

## 2022-08-03 DIAGNOSIS — H527 Unspecified disorder of refraction: Secondary | ICD-10-CM | POA: Diagnosis not present

## 2022-10-20 DIAGNOSIS — E113293 Type 2 diabetes mellitus with mild nonproliferative diabetic retinopathy without macular edema, bilateral: Secondary | ICD-10-CM | POA: Diagnosis not present

## 2022-10-20 DIAGNOSIS — H25813 Combined forms of age-related cataract, bilateral: Secondary | ICD-10-CM | POA: Diagnosis not present

## 2022-10-20 DIAGNOSIS — H5203 Hypermetropia, bilateral: Secondary | ICD-10-CM | POA: Diagnosis not present

## 2022-10-29 ENCOUNTER — Other Ambulatory Visit: Payer: Self-pay | Admitting: Family Medicine

## 2022-10-29 ENCOUNTER — Encounter: Payer: Self-pay | Admitting: Family Medicine

## 2022-10-29 ENCOUNTER — Ambulatory Visit (INDEPENDENT_AMBULATORY_CARE_PROVIDER_SITE_OTHER): Payer: Medicare HMO | Admitting: Family Medicine

## 2022-10-29 VITALS — BP 137/78 | HR 101 | Ht 66.0 in | Wt 196.0 lb

## 2022-10-29 DIAGNOSIS — F419 Anxiety disorder, unspecified: Secondary | ICD-10-CM

## 2022-10-29 DIAGNOSIS — N181 Chronic kidney disease, stage 1: Secondary | ICD-10-CM

## 2022-10-29 DIAGNOSIS — E1159 Type 2 diabetes mellitus with other circulatory complications: Secondary | ICD-10-CM

## 2022-10-29 DIAGNOSIS — Z1231 Encounter for screening mammogram for malignant neoplasm of breast: Secondary | ICD-10-CM

## 2022-10-29 DIAGNOSIS — E1122 Type 2 diabetes mellitus with diabetic chronic kidney disease: Secondary | ICD-10-CM | POA: Diagnosis not present

## 2022-10-29 DIAGNOSIS — I152 Hypertension secondary to endocrine disorders: Secondary | ICD-10-CM

## 2022-10-29 DIAGNOSIS — Z87891 Personal history of nicotine dependence: Secondary | ICD-10-CM | POA: Diagnosis not present

## 2022-10-29 LAB — POCT GLYCOSYLATED HEMOGLOBIN (HGB A1C): HbA1c, POC (controlled diabetic range): 7.3 % — AB (ref 0.0–7.0)

## 2022-10-29 NOTE — Assessment & Plan Note (Signed)
Meets criteria for lung cancer screening.  Low dose CT scan ordered.

## 2022-10-29 NOTE — Assessment & Plan Note (Signed)
Diabetes has worsened some since last visit.  She realizes this is related to her diet and will make changes to work on this.

## 2022-10-29 NOTE — Assessment & Plan Note (Signed)
BP is fairly well controlled.  Continue current medications for management of HTN.

## 2022-10-29 NOTE — Assessment & Plan Note (Signed)
Continue setraline at current strength.

## 2022-10-29 NOTE — Progress Notes (Unsigned)
Lisa Phelps - 76 y.o. female MRN [id number]  Date of birth: Apr 30, 1946  Subjective Chief Complaint  Patient presents with   Hypertension   Diabetes    HPI Lisa Phelps is  76 y.o. female here today for follow up visit.   She reports that she is doing well at this time.  She is not checking blood sugars at home but is taking medications as directed.  She has been indulging in more sweets recently.  She denies symptoms related to her diabetes at this time.   Continues on irbesartan/hctz for management of HTN.  Her BP has been well controlled at home.  Denies symptoms including chest pain, shortness of breath, palpitations, headache or vision changes.   Anxiety remains well controlled with zoloft at current strength.    Review of Systems  Constitutional:  Negative for chills, fever, malaise/fatigue and weight loss.  HENT:  Negative for congestion, ear pain and sore throat.   Eyes:  Negative for blurred vision, double vision and pain.  Respiratory:  Negative for cough and shortness of breath.   Cardiovascular:  Negative for chest pain and palpitations.  Gastrointestinal:  Negative for abdominal pain, blood in stool, constipation, heartburn and nausea.  Genitourinary:  Negative for dysuria and urgency.  Musculoskeletal:  Negative for joint pain and myalgias.  Neurological:  Negative for dizziness and headaches.  Endo/Heme/Allergies:  Does not bruise/bleed easily.  Psychiatric/Behavioral:  Negative for depression. The patient is not nervous/anxious and does not have insomnia.     Allergies  Allergen Reactions   Crestor [Rosuvastatin]     cough   Nsaids    Pravastatin Other (See Comments)    myalgias    Past Medical History:  Diagnosis Date   Diabetes (HCC)    Hyperlipidemia    Hypertension     Past Surgical History:  Procedure Laterality Date   ABDOMINAL HYSTERECTOMY  1992   gallbladder removed  1998    Social History   Socioeconomic History   Marital status:  Married    Spouse name: Not on file   Number of children: Not on file   Years of education: Not on file   Highest education level: Not on file  Occupational History   Not on file  Tobacco Use   Smoking status: Former    Packs/day: 1.00    Years: 40.00    Total pack years: 40.00    Types: Cigarettes    Quit date: 03/24/2008    Years since quitting: 14.6   Smokeless tobacco: Never  Substance and Sexual Activity   Alcohol use: Yes   Drug use: No   Sexual activity: Never  Other Topics Concern   Not on file  Social History Narrative   Not on file   Social Determinants of Health   Financial Resource Strain: Not on file  Food Insecurity: Not on file  Transportation Needs: Not on file  Physical Activity: Not on file  Stress: Not on file  Social Connections: Not on file    Family History  Problem Relation Age of Onset   Diabetes Other    Hyperlipidemia Other    Hypertension Other    Diabetes Brother    Hypertension Brother     Health Maintenance  Topic Date Due   Medicare Annual Wellness (AWV)  Never done   Zoster Vaccines- Shingrix (1 of 2) Never done   Lung Cancer Screening  Never done   Diabetic kidney evaluation - Urine ACR  04/06/2017   COVID-19  Vaccine (4 - [facility] risk series) 08/18/2021   INFLUENZA VACCINE  07/13/2022   OPHTHALMOLOGY EXAM  07/23/2022   Diabetic kidney evaluation - GFR measurement  04/29/2023   FOOT EXAM  04/29/2023   HEMOGLOBIN A1C  04/29/2023   COLONOSCOPY (Pts 45-7yrs Insurance coverage will need to be confirmed)  05/13/2024   Pneumonia Vaccine 74+ Years old  Completed   DEXA SCAN  Completed   Hepatitis C Screening  Completed   HPV VACCINES  Aged Out     ----------------------------------------------------------------------------------------------------------------------------------------------------------------------------------------------------------------- Physical Exam BP 137/78   Pulse (!) 101   Ht 5\' 6"  (1.676 m)   Wt 196  lb 0.6 oz (88.9 kg)   SpO2 96%   BMI 31.64 kg/m   Physical Exam Constitutional:      Appearance: Normal appearance.  HENT:     Head: Normocephalic and atraumatic.  Eyes:     General: No scleral icterus. Cardiovascular:     Rate and Rhythm: Normal rate and regular rhythm.  Pulmonary:     Effort: Pulmonary effort is normal.     Breath sounds: Normal breath sounds.  Neurological:     General: No focal deficit present.     Mental Status: She is alert.  Psychiatric:        Mood and Affect: Mood normal.        Behavior: Behavior normal.     ------------------------------------------------------------------------------------------------------------------------------------------------------------------------------------------------------------------- Assessment and Plan  Hypertension associated with diabetes (HCC) BP is fairly well controlled.  Continue current medications for management of HTN.   Type 2 diabetes mellitus with diabetic chronic kidney disease (HCC) Diabetes has worsened some since last visit.  She realizes this is related to her diet and will make changes to work on this.   History of smoking 25-50 pack years Meets criteria for lung cancer screening.  Low dose CT scan ordered.   Anxiety Continue setraline at current strength.     No orders of the defined types were placed in this encounter.   Return in about 6 months (around 04/29/2023) for HTN/T2DM/Fasting labs.    This visit occurred during the SARS-CoV-2 public health emergency.  Safety protocols were in place, including screening questions prior to the visit, additional usage of staff PPE, and extensive cleaning of exam room while observing appropriate contact time as indicated for disinfecting solutions.

## 2022-11-30 DIAGNOSIS — E113512 Type 2 diabetes mellitus with proliferative diabetic retinopathy with macular edema, left eye: Secondary | ICD-10-CM | POA: Diagnosis not present

## 2022-11-30 DIAGNOSIS — H34212 Partial retinal artery occlusion, left eye: Secondary | ICD-10-CM | POA: Diagnosis not present

## 2022-11-30 DIAGNOSIS — H25813 Combined forms of age-related cataract, bilateral: Secondary | ICD-10-CM | POA: Diagnosis not present

## 2022-11-30 DIAGNOSIS — E113591 Type 2 diabetes mellitus with proliferative diabetic retinopathy without macular edema, right eye: Secondary | ICD-10-CM | POA: Diagnosis not present

## 2022-12-13 DIAGNOSIS — R911 Solitary pulmonary nodule: Secondary | ICD-10-CM

## 2022-12-13 HISTORY — DX: Solitary pulmonary nodule: R91.1

## 2022-12-15 ENCOUNTER — Ambulatory Visit: Payer: Medicare HMO

## 2022-12-22 ENCOUNTER — Ambulatory Visit (INDEPENDENT_AMBULATORY_CARE_PROVIDER_SITE_OTHER): Payer: Medicare HMO

## 2022-12-22 DIAGNOSIS — Z122 Encounter for screening for malignant neoplasm of respiratory organs: Secondary | ICD-10-CM | POA: Diagnosis not present

## 2022-12-22 DIAGNOSIS — Z1231 Encounter for screening mammogram for malignant neoplasm of breast: Secondary | ICD-10-CM

## 2022-12-22 DIAGNOSIS — Z87891 Personal history of nicotine dependence: Secondary | ICD-10-CM | POA: Diagnosis not present

## 2022-12-24 ENCOUNTER — Other Ambulatory Visit: Payer: Self-pay | Admitting: Family Medicine

## 2022-12-24 DIAGNOSIS — R911 Solitary pulmonary nodule: Secondary | ICD-10-CM

## 2022-12-27 DIAGNOSIS — E113591 Type 2 diabetes mellitus with proliferative diabetic retinopathy without macular edema, right eye: Secondary | ICD-10-CM | POA: Diagnosis not present

## 2022-12-29 ENCOUNTER — Encounter: Payer: Self-pay | Admitting: Pulmonary Disease

## 2022-12-29 ENCOUNTER — Ambulatory Visit: Payer: Medicare HMO | Admitting: Pulmonary Disease

## 2022-12-29 VITALS — BP 100/80 | HR 101 | Temp 98.0°F | Ht 67.0 in | Wt 191.2 lb

## 2022-12-29 DIAGNOSIS — R911 Solitary pulmonary nodule: Secondary | ICD-10-CM

## 2022-12-29 DIAGNOSIS — Z87891 Personal history of nicotine dependence: Secondary | ICD-10-CM | POA: Diagnosis not present

## 2022-12-29 NOTE — Patient Instructions (Signed)
Thank you for visiting Dr. Valeta Harms at [facility]. Today we recommend the following:  Orders Placed This Encounter  Procedures   Procedural/ Surgical Case Request: ROBOTIC ASSISTED NAVIGATIONAL BRONCHOSCOPY   Novel Coronavirus, NAA ([facility])   NM PET Image Initial (PI) Skull Base To Thigh (F-18 FDG)   CT Super D Chest Wo Contrast   Ambulatory referral to Pulmonology   Bronchoscopy on 01/18/2023  Return in about 27 days (around 01/25/2023) for with Eric Form, NP.    Please do your part to reduce the spread of COVID-19.

## 2022-12-29 NOTE — Progress Notes (Signed)
Synopsis: Referred in January 2024 for pulmonary nodule by Luetta Nutting, DO  Subjective:   PATIENT ID: Lisa Phelps GENDER: female DOB: 1946/02/19, MRN: [id number]  Chief Complaint  Patient presents with   Consult    Lung nodule    This is a 77 year old female, past medical history of diabetes hypertension hyperlipidemia, former smoker quit 2009, 40-pack-year history. Patient was referred after having an abnormal CT scan of the chest.  Patient had a lung cancer screening CT completed on 12/22/2022.  This revealed a large right upper lobe pulmonary nodule concerning for malignancy.  It measured 18.7 mm in size.  Patient had a nuclear medicine PET scan ordered by primary care.  Referred today for next steps in evaluation. Patient has not had pulmonary function test completed in the past.    Past Medical History:  Diagnosis Date   Diabetes ([place])    Hyperlipidemia    Hypertension      Family History  Problem Relation Age of Onset   Diabetes Other    Hyperlipidemia Other    Hypertension Other    Diabetes Brother    Hypertension Brother      Past Surgical History:  Procedure Laterality Date   ABDOMINAL HYSTERECTOMY  1992   gallbladder removed  1998    Social History   Socioeconomic History   Marital status: Married    Spouse name: Not on file   Number of children: Not on file   Years of education: Not on file   Highest education level: Not on file  Occupational History   Not on file  Tobacco Use   Smoking status: Former    Packs/day: 1.00    Years: 40.00    Total pack years: 40.00    Types: Cigarettes    Quit date: 03/24/2008    Years since quitting: 14.7   Smokeless tobacco: Never  Substance and Sexual Activity   Alcohol use: Yes   Drug use: No   Sexual activity: Never  Other Topics Concern   Not on file  Social History Narrative   Not on file   Social Determinants of Health   Financial Resource Strain: Not on file  Food Insecurity: Not on file   Transportation Needs: Not on file  Physical Activity: Not on file  Stress: Not on file  Social Connections: Not on file  Intimate Partner Violence: Not on file     Allergies  Allergen Reactions   Crestor [Rosuvastatin]     cough   Nsaids    Pravastatin Other (See Comments)    myalgias     Outpatient Medications Prior to Visit  Medication Sig Dispense Refill   acetaminophen (TYLENOL) 650 MG CR tablet Take 1 tablet (650 mg total) by mouth every 8 (eight) hours as needed for pain. 90 tablet 3   albuterol (PROVENTIL HFA;VENTOLIN HFA) 108 (90 Base) MCG/ACT inhaler Inhale 2 puffs into the lungs every 6 (six) hours as needed for wheezing or shortness of breath. (Patient not taking: Reported on 12/29/2022) 1 Inhaler 3   Alcohol Swabs (B-D SINGLE USE SWABS REGULAR) PADS USE AS DIRECTED TO CLEAN INJECTION SITE 300 each 3   AMBULATORY NON FORMULARY MEDICATION Glucometer of choice with test strips and control solution sufficient for testing daily.. Dispense quantity sufficient for 90 days dx Type 2 diabetes 250.00 90 strip 11   aspirin 81 MG tablet Take 81 mg by mouth daily.     atorvastatin (LIPITOR) 20 MG tablet TAKE 1 TABLET EVERY DAY 90  tablet 3   benzonatate (TESSALON) 200 MG capsule TAKE 1 CAPSULE THREE TIMES DAILY AS NEEDED (Patient not taking: Reported on 10/29/2022) 90 capsule 1   Blood Glucose Monitoring Suppl (TRUE METRIX METER) w/Device KIT USE AS DIRECTED 1 kit 0   irbesartan-hydrochlorothiazide (AVALIDE) 300-12.5 MG tablet TAKE 1 TABLET EVERY DAY 90 tablet 3   metFORMIN (GLUCOPHAGE) 500 MG tablet TAKE 1 TABLET TWICE DAILY 180 tablet 3   sertraline (ZOLOFT) 100 MG tablet Take 1 tablet (100 mg total) by mouth daily. 90 tablet 2   TRUE METRIX BLOOD GLUCOSE TEST test strip TEST BLOOD SUGAR AS NEEDED AS INSTRUCTED 300 strip 3   TRUEplus Lancets 30G MISC USE AS DIRECTED 300 each 0   No facility-administered medications prior to visit.    Review of Systems  Constitutional:  Negative  for chills, fever, malaise/fatigue and weight loss.  HENT:  Negative for hearing loss, sore throat and tinnitus.   Eyes:  Negative for blurred vision and double vision.  Respiratory:  Negative for cough, hemoptysis, sputum production, shortness of breath, wheezing and stridor.   Cardiovascular:  Negative for chest pain, palpitations, orthopnea, leg swelling and PND.  Gastrointestinal:  Negative for abdominal pain, constipation, diarrhea, heartburn, nausea and vomiting.  Genitourinary:  Negative for dysuria, hematuria and urgency.  Musculoskeletal:  Negative for joint pain and myalgias.  Skin:  Negative for itching and rash.  Neurological:  Negative for dizziness, tingling, weakness and headaches.  Endo/Heme/Allergies:  Negative for environmental allergies. Does not bruise/bleed easily.  Psychiatric/Behavioral:  Negative for depression. The patient is not nervous/anxious and does not have insomnia.   All other systems reviewed and are negative.    Objective:  Physical Exam Vitals reviewed.  Constitutional:      General: She is not in acute distress.    Appearance: She is well-developed.  HENT:     Head: Normocephalic and atraumatic.  Eyes:     General: No scleral icterus.    Conjunctiva/sclera: Conjunctivae normal.     Pupils: Pupils are equal, round, and reactive to light.  Neck:     Vascular: No JVD.     Trachea: No tracheal deviation.  Cardiovascular:     Rate and Rhythm: Normal rate and regular rhythm.     Heart sounds: No murmur heard. Pulmonary:     Effort: Pulmonary effort is normal. No tachypnea, accessory muscle usage or respiratory distress.     Breath sounds: No stridor. No wheezing, rhonchi or rales.  Abdominal:     General: There is no distension.     Palpations: Abdomen is soft.     Tenderness: There is no abdominal tenderness.  Musculoskeletal:        General: No tenderness.     Cervical back: Neck supple.  Lymphadenopathy:     Cervical: No cervical  adenopathy.  Skin:    General: Skin is warm and dry.     Capillary Refill: Capillary refill takes less than 2 seconds.     Findings: No rash.  Neurological:     Mental Status: She is alert and oriented to person, place, and time.  Psychiatric:        Behavior: Behavior normal.      Vitals:   12/29/22 1104  BP: 100/80  Pulse: (!) 101  Temp: 98 F (36.7 C)  SpO2: 97%  Weight: 191 lb 3.2 oz (86.7 kg)  Height: 5\' 7"  (1.702 m)   97% on RA BMI Readings from Last 3 Encounters:  12/29/22 29.95  kg/m  10/29/22 31.64 kg/m  04/28/22 30.76 kg/m   Wt Readings from Last 3 Encounters:  12/29/22 191 lb 3.2 oz (86.7 kg)  10/29/22 196 lb 0.6 oz (88.9 kg)  04/28/22 196 lb (88.9 kg)     CBC    Component Value Date/Time   WBC 5.6 04/28/2022 0000   RBC 4.39 04/28/2022 0000   HGB 13.4 04/28/2022 0000   HCT 41.6 04/28/2022 0000   PLT 318 04/28/2022 0000   MCV 94.8 04/28/2022 0000   MCH 30.5 04/28/2022 0000   MCHC 32.2 04/28/2022 0000   RDW 13.6 04/28/2022 0000   LYMPHSABS 2,223 04/28/2022 0000   EOSABS 140 04/28/2022 0000   BASOSABS 28 04/28/2022 0000     Chest Imaging: Lung cancer screening CT January 2024: Right upper lobe lung nodule 18 mm in size. The patient's images have been independently reviewed by me.    Pulmonary Functions Testing Results:     No data to display          FeNO:   Pathology:   Echocardiogram:   Heart Catheterization:     Assessment & Plan:     ICD-10-CM   1. Nodule of upper lobe of right lung  R91.1 Procedural/ Surgical Case Request: ROBOTIC ASSISTED NAVIGATIONAL BRONCHOSCOPY    Ambulatory referral to Pulmonology    Novel Coronavirus, NAA ([facility])    NM PET Image Initial (PI) Skull Base To Thigh (F-18 FDG)    CT Super D Chest Wo Contrast    2. Former smoker  Z87.891       Discussion:  This is a 77 year old female longstanding history of tobacco use, quit smoking 14 years ago has abnormal lung cancer screening CT with a new  right upper lobe nodule.  It is adjacent to the pleura has rounded smooth edges.  Plan: I think it would be prudent to have a PET scan before we consider biopsying the lesion. This will help [place] localize best target in case this lesion is not hypermetabolic throughout. Characteristics of the nodule do not necessarily resemble a malignancy. However due to the size and location and history of tobacco use malignancy remains a concern. PET scan will help [place] delineate next best steps. I do think that is high probability of malignancy and we should go ahead consider scheduling her bronchoscopy. She is okay with this plan as well. We will tentatively schedule bronchoscopy on 01/18/2023. Hopefully will get her PET scan done before that. She will need a repeat super D CT for the bronchoscopy planning. We will have her return approximately 1 week after bronc with Kandice Robinsons, NP     Current Outpatient Medications:    acetaminophen (TYLENOL) 650 MG CR tablet, Take 1 tablet (650 mg total) by mouth every 8 (eight) hours as needed for pain., Disp: 90 tablet, Rfl: 3   albuterol (PROVENTIL HFA;VENTOLIN HFA) 108 (90 Base) MCG/ACT inhaler, Inhale 2 puffs into the lungs every 6 (six) hours as needed for wheezing or shortness of breath. (Patient not taking: Reported on 12/29/2022), Disp: 1 Inhaler, Rfl: 3   Alcohol Swabs (B-D SINGLE USE SWABS REGULAR) PADS, USE AS DIRECTED TO CLEAN INJECTION SITE, Disp: 300 each, Rfl: 3   AMBULATORY NON FORMULARY MEDICATION, Glucometer of choice with test strips and control solution sufficient for testing daily.. Dispense quantity sufficient for 90 days dx Type 2 diabetes 250.00, Disp: 90 strip, Rfl: 11   aspirin 81 MG tablet, Take 81 mg by mouth daily., Disp: , Rfl:  atorvastatin (LIPITOR) 20 MG tablet, TAKE 1 TABLET EVERY DAY, Disp: 90 tablet, Rfl: 3   benzonatate (TESSALON) 200 MG capsule, TAKE 1 CAPSULE THREE TIMES DAILY AS NEEDED (Patient not taking: Reported on 10/29/2022),  Disp: 90 capsule, Rfl: 1   Blood Glucose Monitoring Suppl (TRUE METRIX METER) w/Device KIT, USE AS DIRECTED, Disp: 1 kit, Rfl: 0   irbesartan-hydrochlorothiazide (AVALIDE) 300-12.5 MG tablet, TAKE 1 TABLET EVERY DAY, Disp: 90 tablet, Rfl: 3   metFORMIN (GLUCOPHAGE) 500 MG tablet, TAKE 1 TABLET TWICE DAILY, Disp: 180 tablet, Rfl: 3   sertraline (ZOLOFT) 100 MG tablet, Take 1 tablet (100 mg total) by mouth daily., Disp: 90 tablet, Rfl: 2   TRUE METRIX BLOOD GLUCOSE TEST test strip, TEST BLOOD SUGAR AS NEEDED AS INSTRUCTED, Disp: 300 strip, Rfl: 3   TRUEplus Lancets 30G MISC, USE AS DIRECTED, Disp: 300 each, Rfl: 0  I spent 62 minutes dedicated to the care of this patient on the date of this encounter to include pre-visit review of records, face-to-face time with the patient discussing conditions above, post visit ordering of testing, clinical documentation with the electronic health record, making appropriate referrals as documented, and communicating necessary findings to members of the patients care team.   Josephine Igo, DO [facility] 12/29/2022 11:21 AM

## 2022-12-29 NOTE — H&P (View-Only) (Signed)
Synopsis: Referred in January 2024 for pulmonary nodule by Luetta Nutting, DO  Subjective:   PATIENT ID: Lisa Phelps GENDER: female DOB: 1946/02/19, MRN: [id number]  Chief Complaint  Patient presents with   Consult    Lung nodule    This is a 77 year old female, past medical history of diabetes hypertension hyperlipidemia, former smoker quit 2009, 40-pack-year history. Patient was referred after having an abnormal CT scan of the chest.  Patient had a lung cancer screening CT completed on 12/22/2022.  This revealed a large right upper lobe pulmonary nodule concerning for malignancy.  It measured 18.7 mm in size.  Patient had a nuclear medicine PET scan ordered by primary care.  Referred today for next steps in evaluation. Patient has not had pulmonary function test completed in the past.    Past Medical History:  Diagnosis Date   Diabetes ([place])    Hyperlipidemia    Hypertension      Family History  Problem Relation Age of Onset   Diabetes Other    Hyperlipidemia Other    Hypertension Other    Diabetes Brother    Hypertension Brother      Past Surgical History:  Procedure Laterality Date   ABDOMINAL HYSTERECTOMY  1992   gallbladder removed  1998    Social History   Socioeconomic History   Marital status: Married    Spouse name: Not on file   Number of children: Not on file   Years of education: Not on file   Highest education level: Not on file  Occupational History   Not on file  Tobacco Use   Smoking status: Former    Packs/day: 1.00    Years: 40.00    Total pack years: 40.00    Types: Cigarettes    Quit date: 03/24/2008    Years since quitting: 14.7   Smokeless tobacco: Never  Substance and Sexual Activity   Alcohol use: Yes   Drug use: No   Sexual activity: Never  Other Topics Concern   Not on file  Social History Narrative   Not on file   Social Determinants of Health   Financial Resource Strain: Not on file  Food Insecurity: Not on file   Transportation Needs: Not on file  Physical Activity: Not on file  Stress: Not on file  Social Connections: Not on file  Intimate Partner Violence: Not on file     Allergies  Allergen Reactions   Crestor [Rosuvastatin]     cough   Nsaids    Pravastatin Other (See Comments)    myalgias     Outpatient Medications Prior to Visit  Medication Sig Dispense Refill   acetaminophen (TYLENOL) 650 MG CR tablet Take 1 tablet (650 mg total) by mouth every 8 (eight) hours as needed for pain. 90 tablet 3   albuterol (PROVENTIL HFA;VENTOLIN HFA) 108 (90 Base) MCG/ACT inhaler Inhale 2 puffs into the lungs every 6 (six) hours as needed for wheezing or shortness of breath. (Patient not taking: Reported on 12/29/2022) 1 Inhaler 3   Alcohol Swabs (B-D SINGLE USE SWABS REGULAR) PADS USE AS DIRECTED TO CLEAN INJECTION SITE 300 each 3   AMBULATORY NON FORMULARY MEDICATION Glucometer of choice with test strips and control solution sufficient for testing daily.. Dispense quantity sufficient for 90 days dx Type 2 diabetes 250.00 90 strip 11   aspirin 81 MG tablet Take 81 mg by mouth daily.     atorvastatin (LIPITOR) 20 MG tablet TAKE 1 TABLET EVERY DAY 90  tablet 3   benzonatate (TESSALON) 200 MG capsule TAKE 1 CAPSULE THREE TIMES DAILY AS NEEDED (Patient not taking: Reported on 10/29/2022) 90 capsule 1   Blood Glucose Monitoring Suppl (TRUE METRIX METER) w/Device KIT USE AS DIRECTED 1 kit 0   irbesartan-hydrochlorothiazide (AVALIDE) 300-12.5 MG tablet TAKE 1 TABLET EVERY DAY 90 tablet 3   metFORMIN (GLUCOPHAGE) 500 MG tablet TAKE 1 TABLET TWICE DAILY 180 tablet 3   sertraline (ZOLOFT) 100 MG tablet Take 1 tablet (100 mg total) by mouth daily. 90 tablet 2   TRUE METRIX BLOOD GLUCOSE TEST test strip TEST BLOOD SUGAR AS NEEDED AS INSTRUCTED 300 strip 3   TRUEplus Lancets 30G MISC USE AS DIRECTED 300 each 0   No facility-administered medications prior to visit.    Review of Systems  Constitutional:  Negative  for chills, fever, malaise/fatigue and weight loss.  HENT:  Negative for hearing loss, sore throat and tinnitus.   Eyes:  Negative for blurred vision and double vision.  Respiratory:  Negative for cough, hemoptysis, sputum production, shortness of breath, wheezing and stridor.   Cardiovascular:  Negative for chest pain, palpitations, orthopnea, leg swelling and PND.  Gastrointestinal:  Negative for abdominal pain, constipation, diarrhea, heartburn, nausea and vomiting.  Genitourinary:  Negative for dysuria, hematuria and urgency.  Musculoskeletal:  Negative for joint pain and myalgias.  Skin:  Negative for itching and rash.  Neurological:  Negative for dizziness, tingling, weakness and headaches.  Endo/Heme/Allergies:  Negative for environmental allergies. Does not bruise/bleed easily.  Psychiatric/Behavioral:  Negative for depression. The patient is not nervous/anxious and does not have insomnia.   All other systems reviewed and are negative.    Objective:  Physical Exam Vitals reviewed.  Constitutional:      General: She is not in acute distress.    Appearance: She is well-developed.  HENT:     Head: Normocephalic and atraumatic.  Eyes:     General: No scleral icterus.    Conjunctiva/sclera: Conjunctivae normal.     Pupils: Pupils are equal, round, and reactive to light.  Neck:     Vascular: No JVD.     Trachea: No tracheal deviation.  Cardiovascular:     Rate and Rhythm: Normal rate and regular rhythm.     Heart sounds: No murmur heard. Pulmonary:     Effort: Pulmonary effort is normal. No tachypnea, accessory muscle usage or respiratory distress.     Breath sounds: No stridor. No wheezing, rhonchi or rales.  Abdominal:     General: There is no distension.     Palpations: Abdomen is soft.     Tenderness: There is no abdominal tenderness.  Musculoskeletal:        General: No tenderness.     Cervical back: Neck supple.  Lymphadenopathy:     Cervical: No cervical  adenopathy.  Skin:    General: Skin is warm and dry.     Capillary Refill: Capillary refill takes less than 2 seconds.     Findings: No rash.  Neurological:     Mental Status: She is alert and oriented to person, place, and time.  Psychiatric:        Behavior: Behavior normal.      Vitals:   12/29/22 1104  BP: 100/80  Pulse: (!) 101  Temp: 98 F (36.7 C)  SpO2: 97%  Weight: 191 lb 3.2 oz (86.7 kg)  Height: 5\' 7"  (1.702 m)   97% on RA BMI Readings from Last 3 Encounters:  12/29/22 29.95  kg/m  10/29/22 31.64 kg/m  04/28/22 30.76 kg/m   Wt Readings from Last 3 Encounters:  12/29/22 191 lb 3.2 oz (86.7 kg)  10/29/22 196 lb 0.6 oz (88.9 kg)  04/28/22 196 lb (88.9 kg)     CBC    Component Value Date/Time   WBC 5.6 04/28/2022 0000   RBC 4.39 04/28/2022 0000   HGB 13.4 04/28/2022 0000   HCT 41.6 04/28/2022 0000   PLT 318 04/28/2022 0000   MCV 94.8 04/28/2022 0000   MCH 30.5 04/28/2022 0000   MCHC 32.2 04/28/2022 0000   RDW 13.6 04/28/2022 0000   LYMPHSABS 2,223 04/28/2022 0000   EOSABS 140 04/28/2022 0000   BASOSABS 28 04/28/2022 0000     Chest Imaging: Lung cancer screening CT January 2024: Right upper lobe lung nodule 18 mm in size. The patient's images have been independently reviewed by me.    Pulmonary Functions Testing Results:     No data to display          FeNO:   Pathology:   Echocardiogram:   Heart Catheterization:     Assessment & Plan:     ICD-10-CM   1. Nodule of upper lobe of right lung  R91.1 Procedural/ Surgical Case Request: ROBOTIC ASSISTED NAVIGATIONAL BRONCHOSCOPY    Ambulatory referral to Pulmonology    Novel Coronavirus, NAA ([facility])    NM PET Image Initial (PI) Skull Base To Thigh (F-18 FDG)    CT Super D Chest Wo Contrast    2. Former smoker  Z87.891       Discussion:  This is a 77 year old female longstanding history of tobacco use, quit smoking 14 years ago has abnormal lung cancer screening CT with a new  right upper lobe nodule.  It is adjacent to the pleura has rounded smooth edges.  Plan: I think it would be prudent to have a PET scan before we consider biopsying the lesion. This will help [place] localize best target in case this lesion is not hypermetabolic throughout. Characteristics of the nodule do not necessarily resemble a malignancy. However due to the size and location and history of tobacco use malignancy remains a concern. PET scan will help [place] delineate next best steps. I do think that is high probability of malignancy and we should go ahead consider scheduling her bronchoscopy. She is okay with this plan as well. We will tentatively schedule bronchoscopy on 01/18/2023. Hopefully will get her PET scan done before that. She will need a repeat super D CT for the bronchoscopy planning. We will have her return approximately 1 week after bronc with Kandice Robinsons, NP     Current Outpatient Medications:    acetaminophen (TYLENOL) 650 MG CR tablet, Take 1 tablet (650 mg total) by mouth every 8 (eight) hours as needed for pain., Disp: 90 tablet, Rfl: 3   albuterol (PROVENTIL HFA;VENTOLIN HFA) 108 (90 Base) MCG/ACT inhaler, Inhale 2 puffs into the lungs every 6 (six) hours as needed for wheezing or shortness of breath. (Patient not taking: Reported on 12/29/2022), Disp: 1 Inhaler, Rfl: 3   Alcohol Swabs (B-D SINGLE USE SWABS REGULAR) PADS, USE AS DIRECTED TO CLEAN INJECTION SITE, Disp: 300 each, Rfl: 3   AMBULATORY NON FORMULARY MEDICATION, Glucometer of choice with test strips and control solution sufficient for testing daily.. Dispense quantity sufficient for 90 days dx Type 2 diabetes 250.00, Disp: 90 strip, Rfl: 11   aspirin 81 MG tablet, Take 81 mg by mouth daily., Disp: , Rfl:  atorvastatin (LIPITOR) 20 MG tablet, TAKE 1 TABLET EVERY DAY, Disp: 90 tablet, Rfl: 3   benzonatate (TESSALON) 200 MG capsule, TAKE 1 CAPSULE THREE TIMES DAILY AS NEEDED (Patient not taking: Reported on 10/29/2022),  Disp: 90 capsule, Rfl: 1   Blood Glucose Monitoring Suppl (TRUE METRIX METER) w/Device KIT, USE AS DIRECTED, Disp: 1 kit, Rfl: 0   irbesartan-hydrochlorothiazide (AVALIDE) 300-12.5 MG tablet, TAKE 1 TABLET EVERY DAY, Disp: 90 tablet, Rfl: 3   metFORMIN (GLUCOPHAGE) 500 MG tablet, TAKE 1 TABLET TWICE DAILY, Disp: 180 tablet, Rfl: 3   sertraline (ZOLOFT) 100 MG tablet, Take 1 tablet (100 mg total) by mouth daily., Disp: 90 tablet, Rfl: 2   TRUE METRIX BLOOD GLUCOSE TEST test strip, TEST BLOOD SUGAR AS NEEDED AS INSTRUCTED, Disp: 300 strip, Rfl: 3   TRUEplus Lancets 30G MISC, USE AS DIRECTED, Disp: 300 each, Rfl: 0  I spent 62 minutes dedicated to the care of this patient on the date of this encounter to include pre-visit review of records, face-to-face time with the patient discussing conditions above, post visit ordering of testing, clinical documentation with the electronic health record, making appropriate referrals as documented, and communicating necessary findings to members of the patients care team.   Josephine Igo, DO [facility] 12/29/2022 11:21 AM

## 2023-01-10 DIAGNOSIS — E113512 Type 2 diabetes mellitus with proliferative diabetic retinopathy with macular edema, left eye: Secondary | ICD-10-CM | POA: Diagnosis not present

## 2023-01-14 ENCOUNTER — Ambulatory Visit (HOSPITAL_COMMUNITY)
Admission: RE | Admit: 2023-01-14 | Discharge: 2023-01-14 | Disposition: A | Discharge status: Home / Self Care | Payer: Medicare HMO | Source: Ambulatory Visit | Attending: Pulmonary Disease | Admitting: Pulmonary Disease

## 2023-01-14 ENCOUNTER — Other Ambulatory Visit: Payer: Self-pay | Admitting: *Deleted

## 2023-01-14 DIAGNOSIS — J479 Bronchiectasis, uncomplicated: Secondary | ICD-10-CM | POA: Diagnosis not present

## 2023-01-14 DIAGNOSIS — R911 Solitary pulmonary nodule: Secondary | ICD-10-CM

## 2023-01-14 DIAGNOSIS — J439 Emphysema, unspecified: Secondary | ICD-10-CM | POA: Diagnosis not present

## 2023-01-14 DIAGNOSIS — R918 Other nonspecific abnormal finding of lung field: Secondary | ICD-10-CM | POA: Diagnosis not present

## 2023-01-14 LAB — GLUCOSE, CAPILLARY: Glucose-Capillary: 138 mg/dL — ABNORMAL HIGH (ref 70–99)

## 2023-01-14 MED ORDER — FLUDEOXYGLUCOSE F - 18 (FDG) INJECTION
9.5400 | Freq: Once | INTRAVENOUS | Status: AC
  Administered 2023-01-14: 9.54 via INTRAVENOUS

## 2023-01-16 LAB — SPECIMEN STATUS REPORT

## 2023-01-16 LAB — NOVEL CORONAVIRUS, NAA: SARS-CoV-2, NAA: NOT DETECTED

## 2023-01-17 ENCOUNTER — Other Ambulatory Visit: Payer: Self-pay | Admitting: Family Medicine

## 2023-01-17 ENCOUNTER — Encounter (HOSPITAL_COMMUNITY): Payer: Self-pay | Admitting: Pulmonary Disease

## 2023-01-17 NOTE — Progress Notes (Signed)
PCP - Luetta Nutting, DO Cardiologist - n/a  CT Chest x-ray - 01/14/23 EKG - DOS Stress Test - n/a ECHO - n/a Cardiac Cath -  n/a  ICD Pacemaker/Loop - n/a  Sleep Study -  n/a CPAP - none  Diabetes Type 2 Do not take Metformin on the morning of surgery.  If your blood sugar is less than 70 mg/dL, you will need to treat for low blood sugar: Treat a low blood sugar (less than 70 mg/dL) with  cup of clear juice (cranberry or apple), 4 glucose tablets, OR glucose gel. Recheck blood sugar in 15 minutes after treatment (to make sure it is greater than 70 mg/dL). If your blood sugar is not greater than 70 mg/dL on recheck, call [phone] for further instructions.  Aspirin Instructions: Follow your surgeon's instructions on when to stop aspirin prior to surgery,  If no instructions were given by your surgeon then you will need to call the office for those instructions.  Anesthesia review: Yes  STOP now taking any Aspirin (unless otherwise instructed by your surgeon), Aleve, Naproxen, Ibuprofen, Motrin, Advil, Goody's, BC's, all herbal medications, fish oil, and all vitamins.   Coronavirus Screening Covid test was negative on 01/14/23 Do you have any of the following symptoms:  Cough yes/no: No Fever (>100.66F)  yes/no: No Runny nose yes/no: No Sore throat yes/no: No Difficulty breathing/shortness of breath  yes/no: No  Have you traveled in the last 14 days and where? yes/no: No  Patient verbalized understanding of instructions that were given via phone.

## 2023-01-17 NOTE — Anesthesia Preprocedure Evaluation (Signed)
Anesthesia Evaluation  Patient identified by MRN, date of birth, ID band Patient awake    Reviewed: Allergy & Precautions, NPO status , Patient's Chart, lab work & pertinent test results  Airway Mallampati: II  TM Distance: >3 FB Neck ROM: Full    Dental  (+) Dental Advisory Given   Pulmonary former smoker   breath sounds clear to auscultation       Cardiovascular hypertension, Pt. on medications  Rhythm:Regular Rate:Normal     Neuro/Psych negative neurological ROS     GI/Hepatic negative GI ROS, Neg liver ROS,,,  Endo/Other  diabetes, Type 2    Renal/GU Renal disease     Musculoskeletal  (+) Arthritis ,    Abdominal   Peds  Hematology negative hematology ROS (+)   Anesthesia Other Findings   Reproductive/Obstetrics                             Anesthesia Physical Anesthesia Plan  ASA: 3  Anesthesia Plan: General   Post-op Pain Management: Tylenol PO (pre-op)*   Induction: Intravenous  PONV Risk Score and Plan: 3 and Dexamethasone, Ondansetron and Treatment may vary due to age or medical condition  Airway Management Planned: Oral ETT  Additional Equipment:   Intra-op Plan:   Post-operative Plan: Extubation in OR  Informed Consent: I have reviewed the patients History and Physical, chart, labs and discussed the procedure including the risks, benefits and alternatives for the proposed anesthesia with the patient or authorized representative who has indicated his/her understanding and acceptance.     Dental advisory given  Plan Discussed with: CRNA  Anesthesia Plan Comments: (  )       Anesthesia Quick Evaluation

## 2023-01-17 NOTE — Progress Notes (Signed)
Anesthesia Chart Review: Kathleene Hazel   Case: [id number] Date/Time: 01/18/23 0915   Procedure: ROBOTIC ASSISTED NAVIGATIONAL BRONCHOSCOPY (Right) - ION w/CIOS   Anesthesia type: General   Diagnosis: Nodule of upper lobe of right lung [R91.1]   Pre-op diagnosis: lung nodule   Location: [facility] 3 / Hand ENDOSCOPY   Surgeons: Garner Nash, DO       DISCUSSION: Patient is a 77 year old female scheduled for the above procedure. RUL lung nodule noted on recent LCS chest CT.   History includes former smoker (quit 03/24/08), HTN, HLD, DM2, RUL lung nodule.   She is a same day work-up. Labs and EKG on arrival as indicated. Anesthesia team to evaluate on the day of surgery.    VS: Ht 5\' 7"  (1.702 m)   Wt 86.8 kg   BMI 29.96 kg/m   BP Readings from Last 3 Encounters:  12/29/22 100/80  10/29/22 137/78  04/28/22 136/83   Pulse Readings from Last 3 Encounters:  12/29/22 (!) 101  10/29/22 (!) 101  04/28/22 78   PROVIDERS: Luetta Nutting, DO is PCP    LABS: for day of surgery as indicated. Last results in [facility] include: Lab Results  Component Value Date   WBC 5.6 04/28/2022   HGB 13.4 04/28/2022   HCT 41.6 04/28/2022   PLT 318 04/28/2022   GLUCOSE 133 (H) 04/28/2022   CHOL 132 04/28/2022   TRIG 79 04/28/2022   HDL 42 (L) 04/28/2022   LDLCALC 74 04/28/2022   ALT 23 04/28/2022   AST 25 04/28/2022   NA 140 04/28/2022   K 4.0 04/28/2022   CL 102 04/28/2022   CREATININE 0.77 04/28/2022   BUN 9 04/28/2022   CO2 27 04/28/2022   HGBA1C 7.3 (A) 10/29/2022    IMAGES: PET Scan 01/14/23: IMPRESSION: 1. Predominantly fat density subpleural apical segment right upper lobe nodule does not show abnormal hypermetabolism, favoring a benign lesion. Recommend follow-up CT chest in 3 months to ensure continued stability, as clinically indicated. 2. Asymmetrically enlarged right tonsil with associated hypermetabolism. Malignancy cannot be excluded. Consider  direct visualization and tissue sampling, as clinically indicated. 3. Small pulmonary nodules measure 5 mm or less in size. These can be re-evaluated on annual lung cancer screening CT. 4. Tiny right renal stones. 5. Aortic atherosclerosis (ICD10-I70.0). Coronary artery calcification. 6.  Emphysema (ICD10-J43.9).  CT Super D Chest 01/14/23: IMPRESSION: 1. Predominantly fat density subpleural nodule in the apical right upper lobe is not hypermetabolic on today's PET, favoring a benign lesion. Recommend follow-up CT chest in 3 months to ensure continued stability, as clinically indicated. 2. Scattered pulmonary nodules measure 3 mm or less in size. No follow-up needed if patient is low-risk (and has no known or suspected primary neoplasm). Non-contrast chest CT can be considered in 12 months if patient is high-risk. This recommendation follows the consensus statement: Guidelines for Management of Incidental Pulmonary Nodules Detected on CT Images: From the Fleischner Society 2017; Radiology 2017; 284:228-243. 3. Cylindrical bronchiectasis. 4. Aortic atherosclerosis (ICD10-I70.0). Coronary artery calcification. 5.  Emphysema (ICD10-J43.9).    EKG: Last EKG seen is for 01/19/17: SR with sinus arrhythmia, occasional PVCs. Low voltage QRS. Inferior infarct, age undetermined. Cannot rule out anterior infarct, age undetermined.    CV: N/A  Past Medical History:  Diagnosis Date   Diabetes ([place])    Hyperlipidemia    Hypertension    Lung nodule 12/2022   right upper lobe    Past Surgical History:  Procedure Laterality Date   ABDOMINAL HYSTERECTOMY  12/13/1990   COLONOSCOPY  05/2014   gallbladder removed  12/13/1996    MEDICATIONS: No current facility-administered medications for this encounter.    acetaminophen (TYLENOL) 650 MG CR tablet   albuterol (PROVENTIL HFA;VENTOLIN HFA) 108 (90 Base) MCG/ACT inhaler   aspirin 81 MG tablet   atorvastatin (LIPITOR) 20 MG tablet    Cholecalciferol (VITAMIN D3) 50 MCG (2000 UT) capsule   irbesartan-hydrochlorothiazide (AVALIDE) 300-12.5 MG tablet   metFORMIN (GLUCOPHAGE) 500 MG tablet   sertraline (ZOLOFT) 100 MG tablet   Alcohol Swabs (B-D SINGLE USE SWABS REGULAR) PADS   AMBULATORY NON FORMULARY MEDICATION   benzonatate (TESSALON) 200 MG capsule   Blood Glucose Monitoring Suppl (TRUE METRIX METER) w/Device KIT   dexamethasone (DECADRON) 0.1 % ophthalmic solution   ketorolac (ACULAR) 0.5 % ophthalmic solution   TRUE METRIX BLOOD GLUCOSE TEST test strip   TRUEplus Lancets 30G MISC    Myra Gianotti, PA-C Surgical Short Stay/Anesthesiology [facility] Phone [phone] [facility] Phone [phone] 01/17/2023 12:40 PM

## 2023-01-18 ENCOUNTER — Other Ambulatory Visit: Payer: Self-pay

## 2023-01-18 ENCOUNTER — Encounter (HOSPITAL_COMMUNITY): Payer: Self-pay | Admitting: Pulmonary Disease

## 2023-01-18 ENCOUNTER — Ambulatory Visit (HOSPITAL_COMMUNITY): Payer: Medicare HMO | Admitting: Vascular Surgery

## 2023-01-18 ENCOUNTER — Encounter (HOSPITAL_COMMUNITY)
Admission: RE | Disposition: A | Discharge status: Home / Self Care | Payer: Self-pay | Source: Home / Self Care | Attending: Pulmonary Disease

## 2023-01-18 ENCOUNTER — Ambulatory Visit (HOSPITAL_COMMUNITY)
Admission: RE | Admit: 2023-01-18 | Discharge: 2023-01-18 | Disposition: A | Discharge status: Home / Self Care | Payer: Medicare HMO | Attending: Pulmonary Disease | Admitting: Pulmonary Disease

## 2023-01-18 DIAGNOSIS — I10 Essential (primary) hypertension: Secondary | ICD-10-CM | POA: Diagnosis not present

## 2023-01-18 DIAGNOSIS — Z87891 Personal history of nicotine dependence: Secondary | ICD-10-CM

## 2023-01-18 DIAGNOSIS — E785 Hyperlipidemia, unspecified: Secondary | ICD-10-CM | POA: Diagnosis not present

## 2023-01-18 DIAGNOSIS — Z7984 Long term (current) use of oral hypoglycemic drugs: Secondary | ICD-10-CM | POA: Insufficient documentation

## 2023-01-18 DIAGNOSIS — R911 Solitary pulmonary nodule: Secondary | ICD-10-CM | POA: Diagnosis present

## 2023-01-18 DIAGNOSIS — Z539 Procedure and treatment not carried out, unspecified reason: Secondary | ICD-10-CM | POA: Insufficient documentation

## 2023-01-18 DIAGNOSIS — Z09 Encounter for follow-up examination after completed treatment for conditions other than malignant neoplasm: Secondary | ICD-10-CM | POA: Insufficient documentation

## 2023-01-18 DIAGNOSIS — E119 Type 2 diabetes mellitus without complications: Secondary | ICD-10-CM | POA: Diagnosis not present

## 2023-01-18 HISTORY — DX: Other amnesia: R41.3

## 2023-01-18 HISTORY — DX: Unspecified osteoarthritis, unspecified site: M19.90

## 2023-01-18 LAB — CBC
HCT: 45.1 % (ref 36.0–46.0)
Hemoglobin: 14.7 g/dL (ref 12.0–15.0)
MCH: 31.2 pg (ref 26.0–34.0)
MCHC: 32.6 g/dL (ref 30.0–36.0)
MCV: 95.8 fL (ref 80.0–100.0)
Platelets: 294 10*3/uL (ref 150–400)
RBC: 4.71 MIL/uL (ref 3.87–5.11)
RDW: 13.6 % (ref 11.5–15.5)
WBC: 5.8 10*3/uL (ref 4.0–10.5)
nRBC: 0 % (ref 0.0–0.2)

## 2023-01-18 LAB — BASIC METABOLIC PANEL
Anion gap: 10 (ref 5–15)
BUN: 10 mg/dL (ref 8–23)
CO2: 23 mmol/L (ref 22–32)
Calcium: 9.6 mg/dL (ref 8.9–10.3)
Chloride: 103 mmol/L (ref 98–111)
Creatinine, Ser: 0.94 mg/dL (ref 0.44–1.00)
GFR, Estimated: >60 mL/min (ref >60)
Glucose, Bld: 154 mg/dL — ABNORMAL HIGH (ref 70–99)
Potassium: 3.7 mmol/L (ref 3.5–5.1)
Sodium: 136 mmol/L (ref 135–145)

## 2023-01-18 LAB — GLUCOSE, CAPILLARY: Glucose-Capillary: 163 mg/dL — ABNORMAL HIGH (ref 70–99)

## 2023-01-18 SURGERY — CANCELLED PROCEDURE
Anesthesia: General | Laterality: Right

## 2023-01-18 MED ORDER — TRELEGY ELLIPTA 100-62.5-25 MCG/ACT IN AEPB: 1.0000 | INHALATION_SPRAY | Freq: Every day | RESPIRATORY_TRACT | 3 refills | Status: DC

## 2023-01-18 MED ORDER — ACETAMINOPHEN 500 MG PO TABS
1000.0000 mg | ORAL_TABLET | Freq: Once | ORAL | Status: AC
  Administered 2023-01-18: 1000 mg via ORAL
  Filled 2023-01-18: qty 2

## 2023-01-18 MED ORDER — LACTATED RINGERS IV SOLN: INTRAVENOUS | Status: DC

## 2023-01-18 MED ORDER — CHLORHEXIDINE GLUCONATE 0.12 % MT SOLN
15.0000 mL | OROMUCOSAL | Status: AC
  Administered 2023-01-18: 15 mL via OROMUCOSAL
  Filled 2023-01-18: qty 15

## 2023-01-18 MED ORDER — INSULIN ASPART 100 UNIT/ML IJ SOLN: 0.0000 [IU] | INTRAMUSCULAR | Status: DC | PRN

## 2023-01-18 NOTE — Progress Notes (Signed)
PCCM:  Procedure will be cancelled after review and discussion of the PET results with the patient.  Thanks  Muir, DO [facility] 01/18/2023 9:40 AM

## 2023-01-18 NOTE — Interval H&P Note (Signed)
History and Physical Interval Note:  01/18/2023 7:27 AM  Lisa Phelps  has presented today for surgery, with the diagnosis of lung nodule.  The various methods of treatment have been discussed with the patient and family. After consideration of risks, benefits and other options for treatment, the patient has consented to  Procedure(s) with comments: ROBOTIC ASSISTED NAVIGATIONAL BRONCHOSCOPY (Right) - ION w/CIOS as a surgical intervention.  The patient's history has been reviewed, patient examined, no change in status, stable for surgery.  I have reviewed the patient's chart and labs.  Questions were answered to the patient's satisfaction.     [place]

## 2023-01-18 NOTE — Progress Notes (Signed)
MD cancelled surgery scheduled for today due to "fatty infiltrate on PET scan, plan to watch rather than biopsy at this time." Patient aware. IV removed and patient d/c to home with sister.

## 2023-01-25 ENCOUNTER — Other Ambulatory Visit: Payer: Self-pay

## 2023-01-25 ENCOUNTER — Ambulatory Visit: Payer: Medicare HMO | Admitting: Acute Care

## 2023-01-25 DIAGNOSIS — R911 Solitary pulmonary nodule: Secondary | ICD-10-CM

## 2023-01-25 DIAGNOSIS — Z87891 Personal history of nicotine dependence: Secondary | ICD-10-CM

## 2023-03-22 ENCOUNTER — Telehealth: Payer: Self-pay | Admitting: Family Medicine

## 2023-03-22 NOTE — Telephone Encounter (Signed)
Called patient to schedule Medicare Annual Wellness Visit (AWV). Left message for patient to call back and schedule Medicare Annual Wellness Visit (AWV).  Last date of AWV: 2020  Please schedule an appointment at any time with [facility].  If any questions, please contact me at [phone].  Thank you ,  Cira Servant Patient Access Advocate II Direct Dial: [phone]

## 2023-03-23 ENCOUNTER — Ambulatory Visit: Payer: Medicare HMO | Admitting: Pulmonary Disease

## 2023-03-28 ENCOUNTER — Telehealth: Payer: Self-pay | Admitting: Family Medicine

## 2023-03-28 NOTE — Telephone Encounter (Signed)
Ladona Ridgel from [facility] called requesting the results from an EKG and any heart workup that has been done to be faxed over to their office.  Fax number [phone]

## 2023-04-04 ENCOUNTER — Other Ambulatory Visit: Payer: Self-pay | Admitting: Family Medicine

## 2023-04-04 ENCOUNTER — Other Ambulatory Visit: Payer: Self-pay | Admitting: Pulmonary Disease

## 2023-04-11 DIAGNOSIS — E113511 Type 2 diabetes mellitus with proliferative diabetic retinopathy with macular edema, right eye: Secondary | ICD-10-CM | POA: Diagnosis not present

## 2023-04-14 ENCOUNTER — Ambulatory Visit (HOSPITAL_BASED_OUTPATIENT_CLINIC_OR_DEPARTMENT_OTHER)
Admission: RE | Admit: 2023-04-14 | Discharge: 2023-04-14 | Disposition: A | Discharge status: Home / Self Care | Payer: Medicare HMO | Source: Ambulatory Visit | Attending: Acute Care | Admitting: Acute Care

## 2023-04-14 DIAGNOSIS — J439 Emphysema, unspecified: Secondary | ICD-10-CM | POA: Diagnosis not present

## 2023-04-14 DIAGNOSIS — Z87891 Personal history of nicotine dependence: Secondary | ICD-10-CM | POA: Insufficient documentation

## 2023-04-14 DIAGNOSIS — R911 Solitary pulmonary nodule: Secondary | ICD-10-CM | POA: Insufficient documentation

## 2023-04-14 DIAGNOSIS — R918 Other nonspecific abnormal finding of lung field: Secondary | ICD-10-CM | POA: Diagnosis not present

## 2023-04-21 ENCOUNTER — Encounter: Payer: Self-pay | Admitting: Pulmonary Disease

## 2023-04-21 ENCOUNTER — Ambulatory Visit: Payer: Medicare HMO | Admitting: Pulmonary Disease

## 2023-04-21 VITALS — BP 130/70 | HR 74 | Ht 67.0 in | Wt 184.4 lb

## 2023-04-21 DIAGNOSIS — Z87891 Personal history of nicotine dependence: Secondary | ICD-10-CM

## 2023-04-21 DIAGNOSIS — R053 Chronic cough: Secondary | ICD-10-CM | POA: Diagnosis not present

## 2023-04-21 DIAGNOSIS — R911 Solitary pulmonary nodule: Secondary | ICD-10-CM | POA: Diagnosis not present

## 2023-04-21 MED ORDER — BENZONATATE 200 MG PO CAPS: 200.0000 mg | ORAL_CAPSULE | Freq: Three times a day (TID) | ORAL | 2 refills | Status: DC | PRN

## 2023-04-21 NOTE — Progress Notes (Signed)
Synopsis: Referred in January 2024 for pulmonary nodule by Everrett Coombe, DO  Subjective:   PATIENT ID: Lisa Phelps GENDER: female DOB: Jan 13, 1946, MRN: [id number]  Chief Complaint  Patient presents with   Follow-up    F/up    This is a 77 year old female, past medical history of diabetes hypertension hyperlipidemia, former smoker quit 2009, 40-pack-year history. Patient was referred after having an abnormal CT scan of the chest.  Patient had a lung cancer screening CT completed on 12/22/2022.  This revealed a large right upper lobe pulmonary nodule concerning for malignancy.  It measured 18.7 mm in size.  Patient had a nuclear medicine PET scan ordered by primary care.  Referred today for next steps in evaluation. Patient has not had pulmonary function test completed in the past.  OV 04/21/2023: Was originally set up for bronchoscopy biopsy but after PET scan complete there was no significant hypermetabolic uptake within the lesion.  Appeared very benign on imaging.  70-month CT follow-up completed this month shows stability of the lesion and side.  Likely benign.  Question whether or not were dealing with a solitary fibrous tumor of the pleura.  Does not appear fatty under imaging.  Reviewed CT imaging today in the office with patient.    Past Medical History:  Diagnosis Date   Arthritis    Diabetes ([facility])    type 2   Hyperlipidemia    Hypertension    Lung nodule 12/2022   right upper lobe   Memory loss    mild     Family History  Problem Relation Age of Onset   Diabetes Other    Hyperlipidemia Other    Hypertension Other    Diabetes Brother    Hypertension Brother      Past Surgical History:  Procedure Laterality Date   ABDOMINAL HYSTERECTOMY  12/13/1990   COLONOSCOPY  05/2014   EYE SURGERY Bilateral    cataracts   gallbladder removed  12/13/1996    Social History   Socioeconomic History   Marital status: Married    Spouse name: Not on file   Number of  children: Not on file   Years of education: Not on file   Highest education level: Not on file  Occupational History   Not on file  Tobacco Use   Smoking status: Former    Packs/day: 1.00    Years: 40.00    Additional pack years: 0.00    Total pack years: 40.00    Types: Cigarettes    Quit date: 03/24/2008    Years since quitting: 15.0   Smokeless tobacco: Never  Vaping Use   Vaping Use: Never used  Substance and Sexual Activity   Alcohol use: Not Currently    Comment: twice a year   Drug use: No   Sexual activity: Not Currently    Birth control/protection: Post-menopausal, Surgical    Comment: Hysterectomy  Other Topics Concern   Not on file  Social History Narrative   Not on file   Social Determinants of Health   Financial Resource Strain: Not on file  Food Insecurity: Not on file  Transportation Needs: Not on file  Physical Activity: Not on file  Stress: Not on file  Social Connections: Not on file  Intimate Partner Violence: Not on file     Allergies  Allergen Reactions   Crestor [Rosuvastatin]     cough   Nsaids Other (See Comments)    Burn stomach   Pravastatin Other (See Comments)  myalgias     Outpatient Medications Prior to Visit  Medication Sig Dispense Refill   acetaminophen (TYLENOL) 650 MG CR tablet Take 1 tablet (650 mg total) by mouth every 8 (eight) hours as needed for pain. 90 tablet 3   albuterol (PROVENTIL HFA;VENTOLIN HFA) 108 (90 Base) MCG/ACT inhaler Inhale 2 puffs into the lungs every 6 (six) hours as needed for wheezing or shortness of breath. 1 Inhaler 3   Alcohol Swabs (B-D SINGLE USE SWABS REGULAR) PADS USE AS DIRECTED TO CLEAN INJECTION SITE 300 each 3   AMBULATORY NON FORMULARY MEDICATION Glucometer of choice with test strips and control solution sufficient for testing daily.. Dispense quantity sufficient for 90 days dx Type 2 diabetes 250.00 90 strip 11   aspirin 81 MG tablet Take 81 mg by mouth daily.     atorvastatin (LIPITOR)  20 MG tablet TAKE 1 TABLET EVERY DAY 90 tablet 3   Blood Glucose Monitoring Suppl (TRUE METRIX METER) w/Device KIT USE AS DIRECTED 1 kit 0   Cholecalciferol (VITAMIN D3) 50 MCG (2000 UT) capsule Take 2,000 Units by mouth daily.     irbesartan-hydrochlorothiazide (AVALIDE) 300-12.5 MG tablet TAKE 1 TABLET EVERY DAY 90 tablet 3   metFORMIN (GLUCOPHAGE) 500 MG tablet TAKE 1 TABLET TWICE DAILY 180 tablet 3   sertraline (ZOLOFT) 100 MG tablet TAKE 1 TABLET EVERY DAY 90 tablet 0   TRELEGY ELLIPTA 100-62.5-25 MCG/ACT AEPB INHALE 1 PUFF INTO THE LUNGS EVERY DAY 180 each 3   TRUE METRIX BLOOD GLUCOSE TEST test strip TEST BLOOD SUGAR AS NEEDED AS INSTRUCTED 300 strip 3   TRUEplus Lancets 30G MISC USE AS DIRECTED 300 each 0   benzonatate (TESSALON) 200 MG capsule TAKE 1 CAPSULE THREE TIMES DAILY AS NEEDED (Patient not taking: Reported on 10/29/2022) 90 capsule 1   dexamethasone (DECADRON) 0.1 % ophthalmic solution Place 1 drop into the left eye 2 (two) times daily. (Patient not taking: Reported on 01/13/2023)     ketorolac (ACULAR) 0.5 % ophthalmic solution Place 1 drop into the left eye 2 (two) times daily. (Patient not taking: Reported on 01/13/2023)     No facility-administered medications prior to visit.    Review of Systems  Constitutional:  Negative for chills, fever, malaise/fatigue and weight loss.  HENT:  Negative for hearing loss, sore throat and tinnitus.   Eyes:  Negative for blurred vision and double vision.  Respiratory:  Negative for cough, hemoptysis, sputum production, shortness of breath, wheezing and stridor.   Cardiovascular:  Negative for chest pain, palpitations, orthopnea, leg swelling and PND.  Gastrointestinal:  Negative for abdominal pain, constipation, diarrhea, heartburn, nausea and vomiting.  Genitourinary:  Negative for dysuria, hematuria and urgency.  Musculoskeletal:  Negative for joint pain and myalgias.  Skin:  Negative for itching and rash.  Neurological:  Negative for  dizziness, tingling, weakness and headaches.  Endo/Heme/Allergies:  Negative for environmental allergies. Does not bruise/bleed easily.  Psychiatric/Behavioral:  Negative for depression. The patient is not nervous/anxious and does not have insomnia.   All other systems reviewed and are negative.    Objective:  Physical Exam Vitals reviewed.  Constitutional:      General: She is not in acute distress.    Appearance: She is well-developed.  HENT:     Head: Normocephalic and atraumatic.  Eyes:     General: No scleral icterus.    Conjunctiva/sclera: Conjunctivae normal.     Pupils: Pupils are equal, round, and reactive to light.  Neck:  Vascular: No JVD.     Trachea: No tracheal deviation.  Cardiovascular:     Rate and Rhythm: Normal rate and regular rhythm.     Heart sounds: Normal heart sounds. No murmur heard. Pulmonary:     Effort: Pulmonary effort is normal. No tachypnea, accessory muscle usage or respiratory distress.     Breath sounds: No stridor. No wheezing, rhonchi or rales.  Abdominal:     General: There is no distension.     Palpations: Abdomen is soft.     Tenderness: There is no abdominal tenderness.  Musculoskeletal:        General: No tenderness.     Cervical back: Neck supple.  Lymphadenopathy:     Cervical: No cervical adenopathy.  Skin:    General: Skin is warm and dry.     Capillary Refill: Capillary refill takes less than 2 seconds.     Findings: No rash.  Neurological:     Mental Status: She is alert and oriented to person, place, and time.  Psychiatric:        Behavior: Behavior normal.      Vitals:   04/21/23 0918  BP: 130/70  Pulse: 74  SpO2: 98%  Weight: 184 lb 6.4 oz (83.6 kg)  Height: 5\' 7"  (1.702 m)   98% on RA BMI Readings from Last 3 Encounters:  04/21/23 28.88 kg/m  01/18/23 29.96 kg/m  12/29/22 29.95 kg/m   Wt Readings from Last 3 Encounters:  04/21/23 184 lb 6.4 oz (83.6 kg)  01/18/23 191 lb 4.8 oz (86.8 kg)   12/29/22 191 lb 3.2 oz (86.7 kg)     CBC    Component Value Date/Time   WBC 5.8 01/18/2023 0736   RBC 4.71 01/18/2023 0736   HGB 14.7 01/18/2023 0736   HCT 45.1 01/18/2023 0736   PLT 294 01/18/2023 0736   MCV 95.8 01/18/2023 0736   MCH 31.2 01/18/2023 0736   MCHC 32.6 01/18/2023 0736   RDW 13.6 01/18/2023 0736   LYMPHSABS 2,223 04/28/2022 0000   EOSABS 140 04/28/2022 0000   BASOSABS 28 04/28/2022 0000     Chest Imaging: Lung cancer screening CT January 2024: Right upper lobe lung nodule 18 mm in size. The patient's images have been independently reviewed by me.    CT imaging of the chest May 2024: Stable right upper lobe pleural-based nodule. The patient's images have been independently reviewed by me.    Pulmonary Functions Testing Results:     No data to display          FeNO:   Pathology:   Echocardiogram:   Heart Catheterization:     Assessment & Plan:     ICD-10-CM   1. Lung nodule seen on imaging study  R91.1     2. Chronic cough  R05.3     3. Personal history of tobacco use, presenting hazards to health  Z87.891     4. Former smoker  Z87.891       Discussion:  This is a 77 year old female, had a initial abnormal lung cancer screening CT the.  She quit smoking greater than 15 years ago.  Smooth edged pleural-based lesion negative pet imaging.  Plan: I think that the lesion itself is likely benign. Has benign shaped features. Possibly a solitary fibrous tumor of the pleura. Does not appear fatty.  I doubt this is a lipoma. Will recommend no additional follow-up at this time. Would continue her Trelegy for potential underlying COPD. She does think that  it helps.  Can follow-up with me in the future as needed.    Current Outpatient Medications:    acetaminophen (TYLENOL) 650 MG CR tablet, Take 1 tablet (650 mg total) by mouth every 8 (eight) hours as needed for pain., Disp: 90 tablet, Rfl: 3   albuterol (PROVENTIL HFA;VENTOLIN HFA)  108 (90 Base) MCG/ACT inhaler, Inhale 2 puffs into the lungs every 6 (six) hours as needed for wheezing or shortness of breath., Disp: 1 Inhaler, Rfl: 3   Alcohol Swabs (B-D SINGLE USE SWABS REGULAR) PADS, USE AS DIRECTED TO CLEAN INJECTION SITE, Disp: 300 each, Rfl: 3   AMBULATORY NON FORMULARY MEDICATION, Glucometer of choice with test strips and control solution sufficient for testing daily.. Dispense quantity sufficient for 90 days dx Type 2 diabetes 250.00, Disp: 90 strip, Rfl: 11   aspirin 81 MG tablet, Take 81 mg by mouth daily., Disp: , Rfl:    atorvastatin (LIPITOR) 20 MG tablet, TAKE 1 TABLET EVERY DAY, Disp: 90 tablet, Rfl: 3   benzonatate (TESSALON) 200 MG capsule, Take 1 capsule (200 mg total) by mouth 3 (three) times daily as needed for cough., Disp: 90 capsule, Rfl: 2   Blood Glucose Monitoring Suppl (TRUE METRIX METER) w/Device KIT, USE AS DIRECTED, Disp: 1 kit, Rfl: 0   Cholecalciferol (VITAMIN D3) 50 MCG (2000 UT) capsule, Take 2,000 Units by mouth daily., Disp: , Rfl:    irbesartan-hydrochlorothiazide (AVALIDE) 300-12.5 MG tablet, TAKE 1 TABLET EVERY DAY, Disp: 90 tablet, Rfl: 3   metFORMIN (GLUCOPHAGE) 500 MG tablet, TAKE 1 TABLET TWICE DAILY, Disp: 180 tablet, Rfl: 3   sertraline (ZOLOFT) 100 MG tablet, TAKE 1 TABLET EVERY DAY, Disp: 90 tablet, Rfl: 0   TRELEGY ELLIPTA 100-62.5-25 MCG/ACT AEPB, INHALE 1 PUFF INTO THE LUNGS EVERY DAY, Disp: 180 each, Rfl: 3   TRUE METRIX BLOOD GLUCOSE TEST test strip, TEST BLOOD SUGAR AS NEEDED AS INSTRUCTED, Disp: 300 strip, Rfl: 3   TRUEplus Lancets 30G MISC, USE AS DIRECTED, Disp: 300 each, Rfl: 0   benzonatate (TESSALON) 200 MG capsule, TAKE 1 CAPSULE THREE TIMES DAILY AS NEEDED (Patient not taking: Reported on 10/29/2022), Disp: 90 capsule, Rfl: 1   dexamethasone (DECADRON) 0.1 % ophthalmic solution, Place 1 drop into the left eye 2 (two) times daily. (Patient not taking: Reported on 01/13/2023), Disp: , Rfl:    ketorolac (ACULAR) 0.5 %  ophthalmic solution, Place 1 drop into the left eye 2 (two) times daily. (Patient not taking: Reported on 01/13/2023), Disp: , Rfl:     Josephine Igo, DO [facility] 04/21/2023 9:46 AM

## 2023-04-21 NOTE — Patient Instructions (Addendum)
Thank you for visiting Dr. Tonia Brooms at [facility]. Today we recommend the following:  Return in about 1 year (around 04/20/2024), or if symptoms worsen or fail to improve.    Please do your part to reduce the spread of COVID-19.

## 2023-04-28 ENCOUNTER — Telehealth: Payer: Self-pay | Admitting: Family Medicine

## 2023-04-28 NOTE — Telephone Encounter (Signed)
Lisa Phelps with [facility] called.  She is following up on an Order they sent over back in December. Pt has an appointment today.  [name] [phone].

## 2023-04-28 NOTE — Telephone Encounter (Signed)
Rtnd call to [facility] specialist.

## 2023-04-29 ENCOUNTER — Encounter: Payer: Self-pay | Admitting: Family Medicine

## 2023-04-29 ENCOUNTER — Ambulatory Visit (INDEPENDENT_AMBULATORY_CARE_PROVIDER_SITE_OTHER): Payer: Medicare HMO | Admitting: Family Medicine

## 2023-04-29 VITALS — BP 122/83 | HR 83 | Ht 67.0 in | Wt 186.0 lb

## 2023-04-29 DIAGNOSIS — E559 Vitamin D deficiency, unspecified: Secondary | ICD-10-CM | POA: Diagnosis not present

## 2023-04-29 DIAGNOSIS — E785 Hyperlipidemia, unspecified: Secondary | ICD-10-CM

## 2023-04-29 DIAGNOSIS — E1122 Type 2 diabetes mellitus with diabetic chronic kidney disease: Secondary | ICD-10-CM | POA: Diagnosis not present

## 2023-04-29 DIAGNOSIS — R911 Solitary pulmonary nodule: Secondary | ICD-10-CM | POA: Diagnosis not present

## 2023-04-29 DIAGNOSIS — F419 Anxiety disorder, unspecified: Secondary | ICD-10-CM | POA: Diagnosis not present

## 2023-04-29 DIAGNOSIS — R053 Chronic cough: Secondary | ICD-10-CM | POA: Diagnosis not present

## 2023-04-29 DIAGNOSIS — N181 Chronic kidney disease, stage 1: Secondary | ICD-10-CM | POA: Diagnosis not present

## 2023-04-29 DIAGNOSIS — E1159 Type 2 diabetes mellitus with other circulatory complications: Secondary | ICD-10-CM | POA: Diagnosis not present

## 2023-04-29 DIAGNOSIS — I152 Hypertension secondary to endocrine disorders: Secondary | ICD-10-CM | POA: Diagnosis not present

## 2023-04-29 DIAGNOSIS — Z7984 Long term (current) use of oral hypoglycemic drugs: Secondary | ICD-10-CM

## 2023-04-29 LAB — CBC WITH DIFFERENTIAL/PLATELET
Eosinophils Absolute: 108 cells/uL (ref 15–500)
Neutro Abs: 2793 cells/uL (ref 1500–7800)
Neutrophils Relative %: 49 %
RBC: 4.52 10*6/uL (ref 3.80–5.10)

## 2023-04-29 MED ORDER — TRELEGY ELLIPTA 100-62.5-25 MCG/ACT IN AEPB: 1.0000 | INHALATION_SPRAY | Freq: Every day | RESPIRATORY_TRACT | 0 refills | Status: AC

## 2023-04-29 NOTE — Assessment & Plan Note (Signed)
Some increased stress related to her husbands health.  Continue zoloft at current strength.

## 2023-04-29 NOTE — Assessment & Plan Note (Signed)
Stable on recent CT scan.

## 2023-04-29 NOTE — Assessment & Plan Note (Signed)
BP is well controlled with current medications.  Continue irbesartan/hctz at current strength.

## 2023-04-29 NOTE — Progress Notes (Signed)
Lisa Phelps - 77 y.o. female MRN [id number]  Date of birth: 1946/07/18  Subjective Chief Complaint  Patient presents with   Diabetes    HPI Lisa Phelps is a 77 y.o. female here today for follow up visit.   She reports that she is doing well.  Had CT scan in January for lung cancer screening showing large pulmonary nodule.  PET CT did not show hypermetabolism.  She had repeat CT recently showing that area is unchanged.  She continues to see pulmonology.   Remains on metformin or management of diabetes.  She is tolerating this well.  Remains on atorvastatin for management of HLD.    Continues with irbesartan/hctz.  BP is well controlled.  She has not had chest pain, shortness of breath, palpitations, headache or vision changes.    Mood is stable with zoloft.  No side effects with this.   ROS:  A comprehensive ROS was completed and negative except as noted per HPI  Allergies  Allergen Reactions   Crestor [Rosuvastatin]     cough   Nsaids Other (See Comments)    Burn stomach   Pravastatin Other (See Comments)    myalgias    Past Medical History:  Diagnosis Date   Arthritis    Diabetes (HCC)    type 2   Hyperlipidemia    Hypertension    Lung nodule 12/2022   right upper lobe   Memory loss    mild    Past Surgical History:  Procedure Laterality Date   ABDOMINAL HYSTERECTOMY  12/13/1990   COLONOSCOPY  05/2014   EYE SURGERY Bilateral    cataracts   gallbladder removed  12/13/1996    Social History   Socioeconomic History   Marital status: Married    Spouse name: Not on file   Number of children: Not on file   Years of education: Not on file   Highest education level: Not on file  Occupational History   Not on file  Tobacco Use   Smoking status: Former    Packs/day: 1.00    Years: 40.00    Additional pack years: 0.00    Total pack years: 40.00    Types: Cigarettes    Quit date: 03/24/2008    Years since quitting: 15.1   Smokeless tobacco: Never  Vaping  Use   Vaping Use: Never used  Substance and Sexual Activity   Alcohol use: Not Currently    Comment: twice a year   Drug use: No   Sexual activity: Not Currently    Birth control/protection: Post-menopausal, Surgical    Comment: Hysterectomy  Other Topics Concern   Not on file  Social History Narrative   Not on file   Social Determinants of Health   Financial Resource Strain: Not on file  Food Insecurity: Not on file  Transportation Needs: Not on file  Physical Activity: Not on file  Stress: Not on file  Social Connections: Not on file    Family History  Problem Relation Age of Onset   Diabetes Other    Hyperlipidemia Other    Hypertension Other    Diabetes Brother    Hypertension Brother     Health Maintenance  Topic Date Due   Medicare Annual Wellness (AWV)  Never done   Diabetic kidney evaluation - Urine ACR  04/06/2017   OPHTHALMOLOGY EXAM  07/23/2022   Zoster Vaccines- Shingrix (2 of 2) 12/01/2022   FOOT EXAM  04/29/2023   HEMOGLOBIN A1C  04/29/2023  INFLUENZA VACCINE  07/14/2023   Diabetic kidney evaluation - eGFR measurement  01/19/2024   DTaP/Tdap/Td (2 - Td or Tdap) 07/16/2026   Pneumonia Vaccine 65+ Years old  Completed   DEXA SCAN  Completed   COVID-19 Vaccine  Completed   Hepatitis C Screening  Completed   HPV VACCINES  Aged Out   Lung Cancer Screening  Discontinued   COLONOSCOPY (Pts 45-79yrs Insurance coverage will need to be confirmed)  Discontinued     ----------------------------------------------------------------------------------------------------------------------------------------------------------------------------------------------------------------- Physical Exam BP 122/83 (BP Location: Left Arm, Patient Position: Sitting, Cuff Size: Normal)   Pulse 83   Ht 5\' 7"  (1.702 m)   Wt 186 lb (84.4 kg)   SpO2 96%   BMI 29.13 kg/m   Physical Exam Constitutional:      Appearance: Normal appearance.  HENT:     Head: Normocephalic and  atraumatic.  Eyes:     General: No scleral icterus. Cardiovascular:     Rate and Rhythm: Normal rate and regular rhythm.  Pulmonary:     Effort: Pulmonary effort is normal.     Breath sounds: Normal breath sounds.  Musculoskeletal:     Cervical back: Neck supple.  Neurological:     Mental Status: She is alert.  Psychiatric:        Mood and Affect: Mood normal.        Behavior: Behavior normal.     ------------------------------------------------------------------------------------------------------------------------------------------------------------------------------------------------------------------- Assessment and Plan  Hypertension associated with diabetes (HCC) BP is well controlled with current medications.  Continue irbesartan/hctz at current strength.    Nodule of upper lobe of right lung Stable on recent CT scan.    Type 2 diabetes mellitus with diabetic chronic kidney disease (HCC) Diabetes has historically been well controlled.  Update A1c and microalbumin.    Hyperlipidemia LDL goal <100 Doing well with atorvastatin.  Continue at current strength.   Anxiety Some increased stress related to her husbands health.  Continue zoloft at current strength.    Meds ordered this encounter  Medications   Fluticasone-Umeclidin-Vilant (TRELEGY ELLIPTA) 100-62.5-25 MCG/ACT AEPB    Sig: Inhale 1 puff into the lungs daily.    Dispense:  2 each    Refill:  0    Exp:  07/2024 Lot: [id number]    No follow-ups on file.    This visit occurred during the SARS-CoV-2 public health emergency.  Safety protocols were in place, including screening questions prior to the visit, additional usage of staff PPE, and extensive cleaning of exam room while observing appropriate contact time as indicated for disinfecting solutions.

## 2023-04-29 NOTE — Assessment & Plan Note (Signed)
Doing well with atorvastatin.  Continue at current strength.  

## 2023-04-29 NOTE — Assessment & Plan Note (Signed)
Diabetes has historically been well controlled.  Update A1c and microalbumin.

## 2023-04-29 NOTE — Assessment & Plan Note (Signed)
Trelegy added by pulmonology.  Given 2 boxes of samples.

## 2023-04-30 LAB — HEMOGLOBIN A1C
Hgb A1c MFr Bld: 7.2 % of total Hgb — ABNORMAL HIGH (ref <5.7)
Mean Plasma Glucose: 160 mg/dL
eAG (mmol/L): 8.9 mmol/L

## 2023-04-30 LAB — VITAMIN D 25 HYDROXY (VIT D DEFICIENCY, FRACTURES): Vit D, 25-Hydroxy: 69 ng/mL (ref 30–100)

## 2023-04-30 LAB — COMPLETE METABOLIC PANEL WITH GFR
AG Ratio: 1.8 (calc) (ref 1.0–2.5)
ALT: 17 U/L (ref 6–29)
AST: 20 U/L (ref 10–35)
Albumin: 4.3 g/dL (ref 3.6–5.1)
Alkaline phosphatase (APISO): 91 U/L (ref 37–153)
BUN: 12 mg/dL (ref 7–25)
CO2: 29 mmol/L (ref 20–32)
Calcium: 9.7 mg/dL (ref 8.6–10.4)
Chloride: 100 mmol/L (ref 98–110)
Creat: 0.9 mg/dL (ref 0.60–1.00)
Globulin: 2.4 g/dL (calc) (ref 1.9–3.7)
Glucose, Bld: 125 mg/dL — ABNORMAL HIGH (ref 65–99)
Potassium: 4.1 mmol/L (ref 3.5–5.3)
Sodium: 138 mmol/L (ref 135–146)
Total Bilirubin: 0.4 mg/dL (ref 0.2–1.2)
Total Protein: 6.7 g/dL (ref 6.1–8.1)
eGFR: 66 mL/min/{1.73_m2} (ref >=60)

## 2023-04-30 LAB — CBC WITH DIFFERENTIAL/PLATELET
Absolute Monocytes: 450 cells/uL (ref 200–950)
Basophils Absolute: 29 cells/uL (ref 0–200)
Basophils Relative: 0.5 %
Eosinophils Relative: 1.9 %
HCT: 41.9 % (ref 35.0–45.0)
Hemoglobin: 13.6 g/dL (ref 11.7–15.5)
Lymphs Abs: 2320 cells/uL (ref 850–3900)
MCH: 30.1 pg (ref 27.0–33.0)
MCHC: 32.5 g/dL (ref 32.0–36.0)
MCV: 92.7 fL (ref 80.0–100.0)
MPV: 9.6 fL (ref 7.5–12.5)
Monocytes Relative: 7.9 %
Platelets: 303 10*3/uL (ref 140–400)
RDW: 12.9 % (ref 11.0–15.0)
Total Lymphocyte: 40.7 %
WBC: 5.7 10*3/uL (ref 3.8–10.8)

## 2023-04-30 LAB — MICROALBUMIN / CREATININE URINE RATIO
Creatinine, Urine: 36 mg/dL (ref 20–275)
Microalb Creat Ratio: 47 mg/g creat — ABNORMAL HIGH (ref <30)
Microalb, Ur: 1.7 mg/dL

## 2023-04-30 LAB — LIPID PANEL W/REFLEX DIRECT LDL
Cholesterol: 157 mg/dL (ref <200)
HDL: 51 mg/dL (ref >=50)
LDL Cholesterol (Calc): 89 mg/dL (calc)
Non-HDL Cholesterol (Calc): 106 mg/dL (calc) (ref <130)
Total CHOL/HDL Ratio: 3.1 (calc) (ref <5.0)
Triglycerides: 84 mg/dL (ref <150)

## 2023-05-06 DIAGNOSIS — Z7984 Long term (current) use of oral hypoglycemic drugs: Secondary | ICD-10-CM | POA: Diagnosis not present

## 2023-05-06 DIAGNOSIS — N281 Cyst of kidney, acquired: Secondary | ICD-10-CM | POA: Diagnosis not present

## 2023-05-06 DIAGNOSIS — M545 Low back pain, unspecified: Secondary | ICD-10-CM | POA: Diagnosis not present

## 2023-05-06 DIAGNOSIS — I10 Essential (primary) hypertension: Secondary | ICD-10-CM | POA: Diagnosis not present

## 2023-05-06 DIAGNOSIS — Z87891 Personal history of nicotine dependence: Secondary | ICD-10-CM | POA: Diagnosis not present

## 2023-05-06 DIAGNOSIS — M533 Sacrococcygeal disorders, not elsewhere classified: Secondary | ICD-10-CM | POA: Diagnosis not present

## 2023-05-06 DIAGNOSIS — K5900 Constipation, unspecified: Secondary | ICD-10-CM | POA: Diagnosis not present

## 2023-05-06 DIAGNOSIS — E119 Type 2 diabetes mellitus without complications: Secondary | ICD-10-CM | POA: Diagnosis not present

## 2023-05-06 DIAGNOSIS — N200 Calculus of kidney: Secondary | ICD-10-CM | POA: Diagnosis not present

## 2023-05-06 DIAGNOSIS — K5730 Diverticulosis of large intestine without perforation or abscess without bleeding: Secondary | ICD-10-CM | POA: Diagnosis not present

## 2023-05-06 DIAGNOSIS — I7090 Unspecified atherosclerosis: Secondary | ICD-10-CM | POA: Diagnosis not present

## 2023-05-27 ENCOUNTER — Ambulatory Visit: Payer: Medicare HMO | Attending: Family Medicine

## 2023-05-27 ENCOUNTER — Telehealth: Payer: Self-pay | Admitting: Family Medicine

## 2023-05-27 DIAGNOSIS — H34219 Partial retinal artery occlusion, unspecified eye: Secondary | ICD-10-CM

## 2023-05-27 NOTE — Telephone Encounter (Signed)
Received note from [facility] dated 11/30/22, faxed here 05/20/23 that patient had Partial retinal artery Occlusion and needs cardiac work up.  Orders entered for carotid US, Echo, and Zio Patch.  Please contact patient and let her know that we are ordering these based on her eye doctor's recommendations.    Thanks!  CM

## 2023-05-27 NOTE — Progress Notes (Unsigned)
Enrolled patient for a 14 day Zio XT monitor to be mailed to patients home  DOD to read 

## 2023-05-27 NOTE — Telephone Encounter (Signed)
Patient advised.

## 2023-05-31 DIAGNOSIS — H34219 Partial retinal artery occlusion, unspecified eye: Secondary | ICD-10-CM

## 2023-06-18 ENCOUNTER — Other Ambulatory Visit: Payer: Self-pay | Admitting: Family Medicine

## 2023-06-21 DIAGNOSIS — H34219 Partial retinal artery occlusion, unspecified eye: Secondary | ICD-10-CM | POA: Diagnosis not present

## 2023-06-24 ENCOUNTER — Other Ambulatory Visit: Payer: Self-pay | Admitting: Family Medicine

## 2023-06-24 DIAGNOSIS — I471 Supraventricular tachycardia, unspecified: Secondary | ICD-10-CM

## 2023-06-26 ENCOUNTER — Other Ambulatory Visit: Payer: Self-pay | Admitting: Family Medicine

## 2023-07-06 ENCOUNTER — Other Ambulatory Visit: Payer: Self-pay | Admitting: Pulmonary Disease

## 2023-07-08 ENCOUNTER — Ambulatory Visit (HOSPITAL_COMMUNITY): Payer: Medicare HMO | Attending: Family Medicine

## 2023-07-08 DIAGNOSIS — I493 Ventricular premature depolarization: Secondary | ICD-10-CM | POA: Diagnosis not present

## 2023-07-08 DIAGNOSIS — E119 Type 2 diabetes mellitus without complications: Secondary | ICD-10-CM | POA: Insufficient documentation

## 2023-07-08 DIAGNOSIS — Z87891 Personal history of nicotine dependence: Secondary | ICD-10-CM | POA: Diagnosis not present

## 2023-07-08 DIAGNOSIS — H34219 Partial retinal artery occlusion, unspecified eye: Secondary | ICD-10-CM | POA: Diagnosis not present

## 2023-07-08 DIAGNOSIS — I361 Nonrheumatic tricuspid (valve) insufficiency: Secondary | ICD-10-CM

## 2023-07-08 DIAGNOSIS — I10 Essential (primary) hypertension: Secondary | ICD-10-CM | POA: Diagnosis not present

## 2023-07-08 DIAGNOSIS — I700 Atherosclerosis of aorta: Secondary | ICD-10-CM | POA: Insufficient documentation

## 2023-07-08 DIAGNOSIS — E785 Hyperlipidemia, unspecified: Secondary | ICD-10-CM | POA: Diagnosis not present

## 2023-07-08 LAB — ECHOCARDIOGRAM COMPLETE
Area-P 1/2: 2.73 cm2
S' Lateral: 2 cm

## 2023-08-01 ENCOUNTER — Other Ambulatory Visit: Payer: Self-pay | Admitting: Family Medicine

## 2023-08-08 DIAGNOSIS — E113513 Type 2 diabetes mellitus with proliferative diabetic retinopathy with macular edema, bilateral: Secondary | ICD-10-CM | POA: Diagnosis not present

## 2023-08-12 NOTE — Progress Notes (Signed)
Referring-Lisa Jerolyn Center, DO Reason for referral-SVT  HPI: 77 year old female for evaluation of SVT at request of Milford Cage, DO.  Patient recently noted to have partial retinal artery occlusion and echocardiogram, Zio patch and carotid Dopplers recommended.  Echocardiogram July 2024 showed normal LV function, grade 1 diastolic dysfunction, mild mitral valve prolapse with trace mitral regurgitation.  Monitor July 2024 showed sinus rhythm with nonsustained ventricular tachycardia (longest 5 beats), short runs of SVT (longest 16.3 seconds), frequent PVCs and PACs.  Cardiology now asked to evaluate.  Patient denies dyspnea, chest pain or syncope.  Occasional brief flutters when she becomes angry.  Cardiology now asked to evaluate.  Current Outpatient Medications  Medication Sig Dispense Refill   acetaminophen (TYLENOL) 650 MG CR tablet Take 1 tablet (650 mg total) by mouth every 8 (eight) hours as needed for pain. 90 tablet 3   albuterol (PROVENTIL HFA;VENTOLIN HFA) 108 (90 Base) MCG/ACT inhaler Inhale 2 puffs into the lungs every 6 (six) hours as needed for wheezing or shortness of breath. 1 Inhaler 3   Alcohol Swabs (B-D SINGLE USE SWABS REGULAR) PADS USE AS DIRECTED TO CLEAN INJECTION SITE 300 each 3   AMBULATORY NON FORMULARY MEDICATION Glucometer of choice with test strips and control solution sufficient for testing daily.. Dispense quantity sufficient for 90 days dx Type 2 diabetes 250.00 90 strip 11   aspirin 81 MG tablet Take 81 mg by mouth daily.     atorvastatin (LIPITOR) 20 MG tablet TAKE 1 TABLET EVERY DAY 90 tablet 2   benzonatate (TESSALON) 200 MG capsule TAKE 1 CAPSULE THREE TIMES DAILY AS NEEDED FOR COUGH 90 capsule 11   Blood Glucose Monitoring Suppl (TRUE METRIX METER) w/Device KIT USE AS DIRECTED 1 kit 0   Cholecalciferol (VITAMIN D3) 50 MCG (2000 UT) capsule Take 2,000 Units by mouth daily.     dexamethasone (DECADRON) 0.1 % ophthalmic solution Place 1 drop into the left eye  2 (two) times daily.     Fluticasone-Umeclidin-Vilant (TRELEGY ELLIPTA) 100-62.5-25 MCG/ACT AEPB Inhale 1 puff into the lungs daily. 2 each 0   irbesartan-hydrochlorothiazide (AVALIDE) 300-12.5 MG tablet TAKE 1 TABLET EVERY DAY 90 tablet 3   ketorolac (ACULAR) 0.5 % ophthalmic solution Place 1 drop into the left eye 2 (two) times daily.     metFORMIN (GLUCOPHAGE) 500 MG tablet TAKE 1 TABLET TWICE DAILY 180 tablet 3   sertraline (ZOLOFT) 100 MG tablet TAKE 1 TABLET EVERY DAY 90 tablet 0   TRELEGY ELLIPTA 100-62.5-25 MCG/ACT AEPB INHALE 1 PUFF INTO THE LUNGS EVERY DAY 180 each 3   TRUE METRIX BLOOD GLUCOSE TEST test strip TEST BLOOD SUGAR AS NEEDED AS INSTRUCTED 300 strip 3   TRUEplus Lancets 30G MISC USE AS DIRECTED 300 each 0   No current facility-administered medications for this visit.    Allergies  Allergen Reactions   Crestor [Rosuvastatin]     cough   Nsaids Other (See Comments)    Burn stomach   Pravastatin Other (See Comments)    myalgias     Past Medical History:  Diagnosis Date   Arthritis    Diabetes (HCC)    type 2   Hyperlipidemia    Hypertension    Lung nodule 12/2022   right upper lobe   Memory loss    mild    Past Surgical History:  Procedure Laterality Date   ABDOMINAL HYSTERECTOMY  12/13/1990   COLONOSCOPY  05/2014   EYE SURGERY Bilateral    cataracts  gallbladder removed  12/13/1996    Social History   Socioeconomic History   Marital status: Married    Spouse name: Not on file   Number of children: Not on file   Years of education: Not on file   Highest education level: Not on file  Occupational History   Not on file  Tobacco Use   Smoking status: Former    Current packs/day: 0.00    Average packs/day: 1 pack/day for 40.0 years (40.0 ttl pk-yrs)    Types: Cigarettes    Start date: 03/24/1968    Quit date: 03/24/2008    Years since quitting: 15.4   Smokeless tobacco: Never  Vaping Use   Vaping status: Never Used  Substance and Sexual  Activity   Alcohol use: Not Currently    Comment: twice a year   Drug use: No   Sexual activity: Not Currently    Birth control/protection: Post-menopausal, Surgical    Comment: Hysterectomy  Other Topics Concern   Not on file  Social History Narrative   Not on file   Social Determinants of Health   Financial Resource Strain: Not on file  Food Insecurity: Not on file  Transportation Needs: Not on file  Physical Activity: Not on file  Stress: Not on file  Social Connections: Unknown (05/06/2023)   Received from [facility], [facility]   Social Network    Social Network: Not on file  Intimate Partner Violence: Not At Risk (05/06/2023)   Received from [facility], [facility]   HITS    Over the last 12 months how often did your partner physically hurt you?: 1    Over the last 12 months how often did your partner insult you or talk down to you?: 1    Over the last 12 months how often did your partner threaten you with physical harm?: 1    Over the last 12 months how often did your partner scream or curse at you?: 1    Family History  Problem Relation Age of Onset   Diabetes Other    Hyperlipidemia Other    Hypertension Other    Diabetes Brother    Hypertension Brother     ROS: no fevers or chills, productive cough, hemoptysis, dysphasia, odynophagia, melena, hematochezia, dysuria, hematuria, rash, seizure activity, orthopnea, PND, pedal edema, claudication. Remaining systems are negative.  Physical Exam:   Blood pressure 120/60, pulse 90, height 5\' 7"  (1.702 m), weight 184 lb 1.9 oz (83.5 kg), SpO2 96%.  General:  Well developed/well nourished in NAD Skin warm/dry Patient not depressed No peripheral clubbing Back-normal HEENT-normal/normal eyelids Neck supple/normal carotid upstroke bilaterally; no bruits; no JVD; no thyromegaly chest - CTA/ normal expansion CV - RRR/normal S1 and S2; no murmurs, rubs or gallops;  PMI nondisplaced Abdomen -NT/ND, no HSM, no  mass, + bowel sounds, no bruit 2+ femoral pulses, no bruits Ext-no edema, chords, 2+ DP Neuro-grossly nonfocal  ECG -January 18, 2023-normal sinus rhythm with PVCs, cannot rule out anterior and inferior infarct.  Personally reviewed  A/P  1 SVT-patient had short episodes of SVT noted on her monitor that was asymptomatic.  There was no documented atrial fibrillation.  No indication for further therapy.  Note LV function is normal.  2 history of partial retinal artery occlusion-echocardiogram showed normal LV function with no source of embolus.  She did not demonstrate atrial fibrillation on the monitor.  Will reschedule carotid Dopplers.  3 hypertension-blood pressure controlled.  Continue present medications.  4  hyperlipidemia-continue statin.  Olga Millers, MD

## 2023-08-22 ENCOUNTER — Ambulatory Visit: Payer: Medicare HMO | Admitting: Cardiology

## 2023-08-22 ENCOUNTER — Encounter: Payer: Self-pay | Admitting: Cardiology

## 2023-08-22 VITALS — BP 120/60 | HR 90 | Ht 67.0 in | Wt 184.1 lb

## 2023-08-22 DIAGNOSIS — E1159 Type 2 diabetes mellitus with other circulatory complications: Secondary | ICD-10-CM | POA: Diagnosis not present

## 2023-08-22 DIAGNOSIS — I152 Hypertension secondary to endocrine disorders: Secondary | ICD-10-CM

## 2023-08-22 DIAGNOSIS — E785 Hyperlipidemia, unspecified: Secondary | ICD-10-CM | POA: Diagnosis not present

## 2023-08-22 DIAGNOSIS — I471 Supraventricular tachycardia, unspecified: Secondary | ICD-10-CM | POA: Diagnosis not present

## 2023-08-22 DIAGNOSIS — H34219 Partial retinal artery occlusion, unspecified eye: Secondary | ICD-10-CM

## 2023-08-30 ENCOUNTER — Ambulatory Visit (INDEPENDENT_AMBULATORY_CARE_PROVIDER_SITE_OTHER): Payer: Medicare HMO

## 2023-08-30 DIAGNOSIS — H34219 Partial retinal artery occlusion, unspecified eye: Secondary | ICD-10-CM

## 2023-08-30 DIAGNOSIS — I6529 Occlusion and stenosis of unspecified carotid artery: Secondary | ICD-10-CM | POA: Diagnosis not present

## 2023-09-27 DIAGNOSIS — H34212 Partial retinal artery occlusion, left eye: Secondary | ICD-10-CM | POA: Diagnosis not present

## 2023-09-27 DIAGNOSIS — E113513 Type 2 diabetes mellitus with proliferative diabetic retinopathy with macular edema, bilateral: Secondary | ICD-10-CM | POA: Diagnosis not present

## 2023-09-27 LAB — HM DIABETES EYE EXAM

## 2023-10-31 ENCOUNTER — Encounter: Payer: Self-pay | Admitting: Family Medicine

## 2023-10-31 ENCOUNTER — Ambulatory Visit (INDEPENDENT_AMBULATORY_CARE_PROVIDER_SITE_OTHER): Payer: Medicare HMO | Admitting: Family Medicine

## 2023-10-31 VITALS — BP 135/85 | HR 78 | Ht 67.0 in | Wt 189.0 lb

## 2023-10-31 DIAGNOSIS — R454 Irritability and anger: Secondary | ICD-10-CM | POA: Diagnosis not present

## 2023-10-31 DIAGNOSIS — E1122 Type 2 diabetes mellitus with diabetic chronic kidney disease: Secondary | ICD-10-CM

## 2023-10-31 DIAGNOSIS — R053 Chronic cough: Secondary | ICD-10-CM

## 2023-10-31 DIAGNOSIS — E1159 Type 2 diabetes mellitus with other circulatory complications: Secondary | ICD-10-CM | POA: Diagnosis not present

## 2023-10-31 DIAGNOSIS — E785 Hyperlipidemia, unspecified: Secondary | ICD-10-CM

## 2023-10-31 DIAGNOSIS — N181 Chronic kidney disease, stage 1: Secondary | ICD-10-CM

## 2023-10-31 DIAGNOSIS — I152 Hypertension secondary to endocrine disorders: Secondary | ICD-10-CM

## 2023-10-31 DIAGNOSIS — Z7984 Long term (current) use of oral hypoglycemic drugs: Secondary | ICD-10-CM | POA: Diagnosis not present

## 2023-10-31 LAB — POCT GLYCOSYLATED HEMOGLOBIN (HGB A1C): HbA1c, POC (controlled diabetic range): 6.5 % (ref 0.0–7.0)

## 2023-10-31 NOTE — Assessment & Plan Note (Signed)
Doing well with atorvastatin.  Continue at current strength.

## 2023-10-31 NOTE — Progress Notes (Signed)
Lisa Phelps - 77 y.o. female MRN [id number]  Date of birth: 28-Jan-1946  Subjective Chief Complaint  Patient presents with   Diabetes   Hypertension    HPI Lisa Phelps is a 77 y.o. female here today for follow up visit.   She reports that she is doing well.    She continues on metformin for management of T2DM.  She is not checking blood sugars at home regularly.  A1c improved to 6.5% today.  Doing well with atorvastatin for associated HLD.   BP is managed with combination of irbesartan/hydrochlorothiazide.  Tolerating well ant BP is well controlled.  Noted to have partial retinal artery occlusion by ophthalmologist.  Has had work up with Echo, holter and carotid dopplers.  She has also been evaluated by cardiology.  Work up did not show anything concerning.  Denies chest pain, shortness of breath, palpitations, headache or vision changes.    She is seeing pulmonology for history of lung nodule.  She is using trelegy daily with albuterol as needed.   ROS:  A comprehensive ROS was completed and negative except as noted per HPI  Allergies  Allergen Reactions   Crestor [Rosuvastatin]     cough   Nsaids Other (See Comments)    Burn stomach   Pravastatin Other (See Comments)    myalgias    Past Medical History:  Diagnosis Date   Arthritis    Diabetes (HCC)    type 2   Hyperlipidemia    Hypertension    Lung nodule 12/2022   right upper lobe   Memory loss    mild    Past Surgical History:  Procedure Laterality Date   ABDOMINAL HYSTERECTOMY  12/13/1990   COLONOSCOPY  05/2014   EYE SURGERY Bilateral    cataracts   gallbladder removed  12/13/1996    Social History   Socioeconomic History   Marital status: Married    Spouse name: Not on file   Number of children: Not on file   Years of education: Not on file   Highest education level: Not on file  Occupational History   Not on file  Tobacco Use   Smoking status: Former    Current packs/day: 0.00    Average  packs/day: 1 pack/day for 40.0 years (40.0 ttl pk-yrs)    Types: Cigarettes    Start date: 03/24/1968    Quit date: 03/24/2008    Years since quitting: 15.6   Smokeless tobacco: Never  Vaping Use   Vaping status: Never Used  Substance and Sexual Activity   Alcohol use: Not Currently    Comment: twice a year   Drug use: No   Sexual activity: Not Currently    Birth control/protection: Post-menopausal, Surgical    Comment: Hysterectomy  Other Topics Concern   Not on file  Social History Narrative   Not on file   Social Determinants of Health   Financial Resource Strain: Not on file  Food Insecurity: Not on file  Transportation Needs: Not on file  Physical Activity: Not on file  Stress: Not on file  Social Connections: Unknown (05/06/2023)   Received from [facility], [facility]   Social Network    Social Network: Not on file    Family History  Problem Relation Age of Onset   Diabetes Other    Hyperlipidemia Other    Hypertension Other    Diabetes Brother    Hypertension Brother     Health Maintenance  Topic Date Due   Medicare  Annual Wellness (AWV)  Never done   Diabetic kidney evaluation - eGFR measurement  04/28/2024   Diabetic kidney evaluation - Urine ACR  04/28/2024   HEMOGLOBIN A1C  04/29/2024   OPHTHALMOLOGY EXAM  09/26/2024   FOOT EXAM  10/30/2024   DTaP/Tdap/Td (2 - Td or Tdap) 07/16/2026   Pneumonia Vaccine 66+ Years old  Completed   INFLUENZA VACCINE  Completed   DEXA SCAN  Completed   COVID-19 Vaccine  Completed   Hepatitis C Screening  Completed   Zoster Vaccines- Shingrix  Completed   HPV VACCINES  Aged Out   Lung Cancer Screening  Discontinued   Colonoscopy  Discontinued     ----------------------------------------------------------------------------------------------------------------------------------------------------------------------------------------------------------------- Physical Exam BP 135/85 (BP Location: Right Arm, Patient  Position: Sitting, Cuff Size: Normal)   Pulse 78   Ht 5\' 7"  (1.702 m)   Wt 189 lb (85.7 kg)   SpO2 93%   BMI 29.60 kg/m   Physical Exam Constitutional:      Appearance: Normal appearance.  Eyes:     General: No scleral icterus. Cardiovascular:     Rate and Rhythm: Normal rate and regular rhythm.  Pulmonary:     Effort: Pulmonary effort is normal.     Breath sounds: Normal breath sounds.  Neurological:     General: No focal deficit present.     Mental Status: She is alert.  Psychiatric:        Mood and Affect: Mood normal.        Behavior: Behavior normal.     ------------------------------------------------------------------------------------------------------------------------------------------------------------------------------------------------------------------- Assessment and Plan  Type 2 diabetes mellitus with diabetic chronic kidney disease (HCC) Diabetes is better controlled today.  Continue metformin.    Hyperlipidemia LDL goal <100 Doing well with atorvastatin.  Continue at current strength.   Hypertension associated with diabetes (HCC) BP is well controlled with current medications.  Continue irbesartan/hctz at current strength.    Irritability Slight improvement with sertraline.  She will continue her current strength.  Chronic cough Stable with trelegy daily and albuterol as needed.    No orders of the defined types were placed in this encounter.   Return in about 6 months (around 04/29/2024) for Type 2 Diabetes, Hypertension.    This visit occurred during the SARS-CoV-2 public health emergency.  Safety protocols were in place, including screening questions prior to the visit, additional usage of staff PPE, and extensive cleaning of exam room while observing appropriate contact time as indicated for disinfecting solutions.

## 2023-10-31 NOTE — Assessment & Plan Note (Signed)
Slight improvement with sertraline.  She will continue her current strength.

## 2023-10-31 NOTE — Assessment & Plan Note (Signed)
BP is well controlled with current medications.  Continue irbesartan/hctz at current strength.

## 2023-10-31 NOTE — Assessment & Plan Note (Signed)
Stable with trelegy daily and albuterol as needed.

## 2023-10-31 NOTE — Assessment & Plan Note (Signed)
Diabetes is better controlled today.  Continue metformin.

## 2024-01-23 ENCOUNTER — Other Ambulatory Visit: Payer: Self-pay | Admitting: Family Medicine

## 2024-01-23 DIAGNOSIS — Z1231 Encounter for screening mammogram for malignant neoplasm of breast: Secondary | ICD-10-CM

## 2024-01-30 DIAGNOSIS — E113513 Type 2 diabetes mellitus with proliferative diabetic retinopathy with macular edema, bilateral: Secondary | ICD-10-CM | POA: Diagnosis not present

## 2024-01-30 DIAGNOSIS — H34212 Partial retinal artery occlusion, left eye: Secondary | ICD-10-CM | POA: Diagnosis not present

## 2024-03-08 ENCOUNTER — Other Ambulatory Visit: Payer: Self-pay | Admitting: Family Medicine

## 2024-03-14 ENCOUNTER — Ambulatory Visit: Payer: Medicare HMO

## 2024-03-14 DIAGNOSIS — Z1231 Encounter for screening mammogram for malignant neoplasm of breast: Secondary | ICD-10-CM

## 2024-04-06 DIAGNOSIS — E113513 Type 2 diabetes mellitus with proliferative diabetic retinopathy with macular edema, bilateral: Secondary | ICD-10-CM | POA: Diagnosis not present

## 2024-04-14 ENCOUNTER — Other Ambulatory Visit: Payer: Self-pay | Admitting: Family Medicine

## 2024-04-16 NOTE — Telephone Encounter (Signed)
 Hold for May appt

## 2024-04-30 ENCOUNTER — Encounter: Payer: Self-pay | Admitting: Family Medicine

## 2024-04-30 ENCOUNTER — Ambulatory Visit (INDEPENDENT_AMBULATORY_CARE_PROVIDER_SITE_OTHER): Payer: Medicare HMO | Admitting: Family Medicine

## 2024-04-30 VITALS — BP 132/85 | HR 71 | Ht 67.0 in | Wt 191.5 lb

## 2024-04-30 DIAGNOSIS — R053 Chronic cough: Secondary | ICD-10-CM

## 2024-04-30 DIAGNOSIS — E1159 Type 2 diabetes mellitus with other circulatory complications: Secondary | ICD-10-CM

## 2024-04-30 DIAGNOSIS — E785 Hyperlipidemia, unspecified: Secondary | ICD-10-CM

## 2024-04-30 DIAGNOSIS — N181 Chronic kidney disease, stage 1: Secondary | ICD-10-CM

## 2024-04-30 DIAGNOSIS — I152 Hypertension secondary to endocrine disorders: Secondary | ICD-10-CM

## 2024-04-30 DIAGNOSIS — Z7984 Long term (current) use of oral hypoglycemic drugs: Secondary | ICD-10-CM

## 2024-04-30 DIAGNOSIS — J42 Unspecified chronic bronchitis: Secondary | ICD-10-CM | POA: Diagnosis not present

## 2024-04-30 DIAGNOSIS — E559 Vitamin D deficiency, unspecified: Secondary | ICD-10-CM

## 2024-04-30 DIAGNOSIS — E1122 Type 2 diabetes mellitus with diabetic chronic kidney disease: Secondary | ICD-10-CM | POA: Diagnosis not present

## 2024-04-30 LAB — POCT GLYCOSYLATED HEMOGLOBIN (HGB A1C): HbA1c, POC (controlled diabetic range): 6.9 % (ref 0.0–7.0)

## 2024-04-30 NOTE — Assessment & Plan Note (Signed)
BP is well controlled with current medications.  Continue irbesartan/hctz at current strength.

## 2024-04-30 NOTE — Progress Notes (Signed)
 Lisa Phelps - 78 y.o. female MRN [id number]  Date of birth: [birth year]  Subjective Chief Complaint  Patient presents with   Diabetes   Hypertension   Hyperlipidemia    HPI Lisa Phelps is a 78 y.o. female here today for follow up visit.   She reports that she is doing well.   She is doing well with current medications for management of diabetes.  Her A1c is increased some to 6.9% today.  She is working on being more active and watching her diet.   Her BP is well controlled..  She denies chest pain, shortness of breath, palpitations,headache or vision changes.  No side effects from current medication.   Remains on trelegy for chronic cough, .likely COPD related.  This is working well for her.  She reports that this has been expensive on insurance.  She was getting patient assistance for this previously.   ROS:  A comprehensive ROS was completed and negative except as noted per HPI     Allergies  Allergen Reactions   Crestor  [Rosuvastatin ]     cough   Nsaids Other (See Comments)    Burn stomach   Pravastatin Other (See Comments)    myalgias    Past Medical History:  Diagnosis Date   Arthritis    Diabetes (HCC)    type 2   Hyperlipidemia    Hypertension    Lung nodule 12/2022   right upper lobe   Memory loss    mild    Past Surgical History:  Procedure Laterality Date   ABDOMINAL HYSTERECTOMY  12/13/1990   COLONOSCOPY  05/2014   EYE SURGERY Bilateral    cataracts   gallbladder removed  12/13/1996    Social History   Socioeconomic History   Marital status: Married    Spouse name: Not on file   Number of children: Not on file   Years of education: Not on file   Highest education level: Not on file  Occupational History   Not on file  Tobacco Use   Smoking status: Former    Current packs/day: 0.00    Average packs/day: 1 pack/day for 40.0 years (40.0 ttl pk-yrs)    Types: Cigarettes    Start date: 03/24/1968    Quit date: 03/24/2008    Years since  quitting: 16.1   Smokeless tobacco: Never  Vaping Use   Vaping status: Never Used  Substance and Sexual Activity   Alcohol use: Not Currently    Comment: twice a year   Drug use: No   Sexual activity: Not Currently    Birth control/protection: Post-menopausal, Surgical    Comment: Hysterectomy  Other Topics Concern   Not on file  Social History Narrative   Not on file   Social Drivers of Health   Financial Resource Strain: Not on file  Food Insecurity: Not on file  Transportation Needs: Not on file  Physical Activity: Not on file  Stress: Not on file  Social Connections: Unknown (05/06/2023)   [facility]   Social Network    Social Network: Not on file    Family History  Problem Relation Age of Onset   Diabetes Other    Hyperlipidemia Other    Hypertension Other    Diabetes Brother    Hypertension Brother     Health Maintenance  Topic Date Due   Medicare Annual Wellness (AWV)  Never done   Diabetic kidney evaluation - eGFR measurement  04/28/2024   Diabetic kidney  evaluation - Urine ACR  04/28/2024   COVID-19 Vaccine (6 - 2024-25 season) 04/29/2024   INFLUENZA VACCINE  07/13/2024   OPHTHALMOLOGY EXAM  09/26/2024   HEMOGLOBIN A1C  10/31/2024   FOOT EXAM  04/30/2025   DTaP/Tdap/Td (2 - Td or Tdap) 07/16/2026   Pneumonia Vaccine 12+ Years old  Completed   DEXA SCAN  Completed   Hepatitis C Screening  Completed   Zoster Vaccines- Shingrix  Completed   HPV VACCINES  Aged Out   Meningococcal B Vaccine  Aged Out   Lung Cancer Screening  Discontinued   Colonoscopy  Discontinued     ----------------------------------------------------------------------------------------------------------------------------------------------------------------------------------------------------------------- Physical Exam BP 132/85 (BP Location: Left Arm, Patient Position: Sitting, Cuff Size: Normal)   Pulse 71   Ht 5\' 7"  (1.702 m)   Wt 191 lb 8 oz  (86.9 kg)   SpO2 94%   BMI 29.99 kg/m   Physical Exam Constitutional:      Appearance: Normal appearance.  Eyes:     General: No scleral icterus. Cardiovascular:     Rate and Rhythm: Normal rate and regular rhythm.  Pulmonary:     Effort: Pulmonary effort is normal.     Breath sounds: Normal breath sounds.  Musculoskeletal:     Cervical back: Neck supple.  Neurological:     Mental Status: She is alert.  Psychiatric:        Mood and Affect: Mood normal.        Behavior: Behavior normal.     ------------------------------------------------------------------------------------------------------------------------------------------------------------------------------------------------------------------- Assessment and Plan  Chronic cough Stable with trelegy daily and albuterol  as needed.  Referral placed to pharmacy to see if assistance with Trelegy is available.    Hypertension associated with diabetes (HCC) BP is well controlled with current medications.  Continue irbesartan /hctz at current strength.    Type 2 diabetes mellitus with diabetic chronic kidney disease (HCC) A1c is up a little.  Continue metformin .  Encouraged dietary changes.    No orders of the defined types were placed in this encounter.   Return in about 6 months (around 10/31/2024) for Type 2 Diabetes.

## 2024-04-30 NOTE — Assessment & Plan Note (Signed)
 Stable with trelegy daily and albuterol  as needed.  Referral placed to pharmacy to see if assistance with Trelegy is available.

## 2024-04-30 NOTE — Assessment & Plan Note (Signed)
 A1c is up a little.  Continue metformin .  Encouraged dietary changes.

## 2024-05-01 LAB — LIPID PANEL
Chol/HDL Ratio: 3.7 ratio (ref 0.0–4.4)
Cholesterol, Total: 164 mg/dL (ref 100–199)
HDL: 44 mg/dL (ref >39)
LDL Chol Calc (NIH): 103 mg/dL — ABNORMAL HIGH (ref 0–99)
Triglycerides: 91 mg/dL (ref 0–149)
VLDL Cholesterol Cal: 17 mg/dL (ref 5–40)

## 2024-05-01 LAB — CBC WITH DIFFERENTIAL/PLATELET
Basophils Absolute: 0 10*3/uL (ref 0.0–0.2)
Basos: 1 %
EOS (ABSOLUTE): 0.1 10*3/uL (ref 0.0–0.4)
Eos: 2 %
Hematocrit: 43.5 % (ref 34.0–46.6)
Hemoglobin: 14 g/dL (ref 11.1–15.9)
Immature Grans (Abs): 0 10*3/uL (ref 0.0–0.1)
Immature Granulocytes: 0 %
Lymphocytes Absolute: 2.3 10*3/uL (ref 0.7–3.1)
Lymphs: 39 %
MCH: 30.8 pg (ref 26.6–33.0)
MCHC: 32.2 g/dL (ref 31.5–35.7)
MCV: 96 fL (ref 79–97)
Monocytes Absolute: 0.4 10*3/uL (ref 0.1–0.9)
Monocytes: 7 %
Neutrophils Absolute: 3 10*3/uL (ref 1.4–7.0)
Neutrophils: 51 %
Platelets: 308 10*3/uL (ref 150–450)
RBC: 4.54 x10E6/uL (ref 3.77–5.28)
RDW: 12.6 % (ref 11.7–15.4)
WBC: 5.9 10*3/uL (ref 3.4–10.8)

## 2024-05-01 LAB — MICROALBUMIN / CREATININE URINE RATIO
Creatinine, Urine: 84.5 mg/dL
Microalb/Creat Ratio: 82 mg/g{creat} — ABNORMAL HIGH (ref 0–29)
Microalbumin, Urine: 69.1 ug/mL

## 2024-05-01 LAB — CMP14+EGFR
ALT: 13 IU/L (ref 0–32)
AST: 19 IU/L (ref 0–40)
Albumin: 4.3 g/dL (ref 3.8–4.8)
Alkaline Phosphatase: 112 IU/L (ref 44–121)
BUN/Creatinine Ratio: 13 (ref 12–28)
BUN: 11 mg/dL (ref 8–27)
Bilirubin Total: <0.2 mg/dL (ref 0.0–1.2)
CO2: 22 mmol/L (ref 20–29)
Calcium: 9.7 mg/dL (ref 8.7–10.3)
Chloride: 99 mmol/L (ref 96–106)
Creatinine, Ser: 0.86 mg/dL (ref 0.57–1.00)
Globulin, Total: 2.6 g/dL (ref 1.5–4.5)
Glucose: 136 mg/dL — ABNORMAL HIGH (ref 70–99)
Potassium: 4.4 mmol/L (ref 3.5–5.2)
Sodium: 139 mmol/L (ref 134–144)
Total Protein: 6.9 g/dL (ref 6.0–8.5)
eGFR: 70 mL/min/{1.73_m2} (ref >59)

## 2024-05-03 ENCOUNTER — Telehealth: Payer: Self-pay | Admitting: *Deleted

## 2024-05-03 NOTE — Progress Notes (Signed)
 Care Guide Pharmacy Note  05/03/2024 Name: Lisa Phelps MRN: [id number] DOB: June 30, 1946  Referred By: Adela Holter, DO Reason for referral: Complex Care Management (Outreach to schedule referral with pharmacist )   Lisa Phelps is a 78 y.o. year old female who is a primary care patient of Adela Holter, DO.  Lisa Phelps was referred to the pharmacist for assistance related to: DMII  Successful contact was made with the patient to discuss pharmacy services including being ready for the pharmacist to call at least 5 minutes before the scheduled appointment time and to have medication bottles and any blood pressure readings ready for review. The patient agreed to meet with the pharmacist via telephone visit on 05/16/2024  Lisa Phelps, CMA [place]  [facility], [facility] Guide Direct Dial: [phone]  Fax: [phone] Website: [email or web address]

## 2024-05-11 ENCOUNTER — Ambulatory Visit: Payer: Self-pay | Admitting: Family Medicine

## 2024-05-16 ENCOUNTER — Other Ambulatory Visit: Payer: Self-pay

## 2024-05-16 NOTE — Progress Notes (Signed)
   05/16/2024  Patient ID: Lisa Phelps, female   DOB: [birth year], 78 y.o.   MRN: [id number]  Subjective/Objective Telephone visit to assist with affordability of Trelegy  Medication Access/Affordability -Patient prescribed Trelegy for chronic cough, which has provided benefit but is very costly for patient -She was provided with a sample of the medication from PCP office last month -Endorses previously receiving through patient assistance program -Endorses affordability and no barriers to adherence in regard to all other medications  Assessment/Plan  Medication Access/Affordability -PAP for Trelegy requires patients spend $600 OOP for the year, so patient is going to contact [facility] to see what she has paid so far -If she does not qualify for Trelegy PAP, we can apply for Breztri PAP through [employer] if provider in agreement with changing medication.  [facility] will cut close to both program's limit.  Follow-up:  Provided patient with my direct phone number, so she can inform if OOP will meet Trelegy PAP requirements  Linn Rich, PharmD, DPLA

## 2024-05-24 ENCOUNTER — Other Ambulatory Visit: Payer: Self-pay | Admitting: Family Medicine

## 2024-05-29 ENCOUNTER — Other Ambulatory Visit: Payer: Self-pay

## 2024-05-29 DIAGNOSIS — E1121 Type 2 diabetes mellitus with diabetic nephropathy: Secondary | ICD-10-CM

## 2024-05-29 MED ORDER — TRUE METRIX METER W/DEVICE KIT: PACK | 0 refills | Status: AC

## 2024-06-01 DIAGNOSIS — E113513 Type 2 diabetes mellitus with proliferative diabetic retinopathy with macular edema, bilateral: Secondary | ICD-10-CM | POA: Diagnosis not present

## 2024-06-19 ENCOUNTER — Ambulatory Visit

## 2024-06-26 ENCOUNTER — Ambulatory Visit (INDEPENDENT_AMBULATORY_CARE_PROVIDER_SITE_OTHER)

## 2024-06-26 VITALS — BP 119/79 | HR 89 | Ht 67.0 in | Wt 186.0 lb

## 2024-06-26 DIAGNOSIS — Z0000 Encounter for general adult medical examination without abnormal findings: Secondary | ICD-10-CM

## 2024-06-26 NOTE — Progress Notes (Signed)
 Subjective:   Lisa Phelps is a 78 y.o. female who presents for Medicare Annual (Subsequent) preventive examination.  Visit Complete: In person  Patient Medicare AWV questionnaire was completed by the patient on n/a; I have confirmed that all information answered by patient is correct and no changes since this date.  Cardiac Risk Factors include: advanced age (>21men, >85 women);diabetes mellitus;dyslipidemia;hypertension;smoking/ tobacco exposure     Objective:    Today's Vitals   06/26/24 0902  BP: 119/79  Pulse: 89  SpO2: 95%  Weight: 186 lb (84.4 kg)  Height: 5' 7 (1.702 m)   Body mass index is 29.13 kg/m.     06/26/2024    9:19 AM 01/18/2023    7:17 AM  Advanced Directives  Does Patient Have a Medical Advance Directive? No No  Would patient like information on creating a medical advance directive? No - Guardian declined No - Patient declined    Current Medications (verified) Outpatient Encounter Medications as of 06/26/2024  Medication Sig   acetaminophen  (TYLENOL ) 650 MG CR tablet Take 1 tablet (650 mg total) by mouth every 8 (eight) hours as needed for pain.   Alcohol Swabs (B-D SINGLE USE SWABS REGULAR) PADS USE AS DIRECTED TO CLEAN INJECTION SITE   AMBULATORY NON FORMULARY MEDICATION Glucometer of choice with test strips and control solution sufficient for testing daily.. Dispense quantity sufficient for 90 days dx Type 2 diabetes 250.00   aspirin 81 MG tablet Take 81 mg by mouth daily.   atorvastatin  (LIPITOR) 20 MG tablet TAKE 1 TABLET EVERY DAY   benzonatate  (TESSALON ) 200 MG capsule TAKE 1 CAPSULE THREE TIMES DAILY AS NEEDED FOR COUGH   Blood Glucose Monitoring Suppl (TRUE METRIX METER) w/Device KIT USE AS DIRECTED   Cholecalciferol (VITAMIN D3) 50 MCG (2000 UT) capsule Take 2,000 Units by mouth daily.   Fluticasone-Umeclidin-Vilant (TRELEGY ELLIPTA ) 100-62.5-25 MCG/ACT AEPB Inhale 1 puff into the lungs daily.   irbesartan -hydrochlorothiazide  (AVALIDE)  300-12.5 MG tablet TAKE 1 TABLET EVERY DAY   ketorolac (ACULAR) 0.5 % ophthalmic solution Place 1 drop into the left eye 2 (two) times daily.   MAGNESIUM PO Take by mouth.   metFORMIN  (GLUCOPHAGE ) 500 MG tablet TAKE 1 TABLET TWICE DAILY   Multiple Vitamin (MULTIVITAMIN WITH MINERALS) TABS tablet Take 1 tablet by mouth daily.   sertraline  (ZOLOFT ) 100 MG tablet TAKE 1 TABLET EVERY DAY   TRELEGY ELLIPTA  100-62.5-25 MCG/ACT AEPB INHALE 1 PUFF INTO THE LUNGS EVERY DAY   TRUE METRIX BLOOD GLUCOSE TEST test strip TEST BLOOD SUGAR AS NEEDED AS INSTRUCTED   TRUEplus Lancets 30G MISC USE AS DIRECTED   albuterol  (PROVENTIL  HFA;VENTOLIN  HFA) 108 (90 Base) MCG/ACT inhaler Inhale 2 puffs into the lungs every 6 (six) hours as needed for wheezing or shortness of breath. (Patient not taking: Reported on 06/26/2024)   No facility-administered encounter medications on file as of 06/26/2024.    Allergies (verified) Crestor  [rosuvastatin ], Nsaids, and Pravastatin   History: Past Medical History:  Diagnosis Date   Arthritis    Diabetes (HCC)    type 2   Hyperlipidemia    Hypertension    Lung nodule 12/2022   right upper lobe   Memory loss    mild   Past Surgical History:  Procedure Laterality Date   ABDOMINAL HYSTERECTOMY  12/13/1990   COLONOSCOPY  05/2014   EYE SURGERY Bilateral    cataracts   gallbladder removed  12/13/1996   Family History  Problem Relation Age of Onset   Diabetes Other  Hyperlipidemia Other    Hypertension Other    Diabetes Brother    Hypertension Brother    Social History   Socioeconomic History   Marital status: Married    Spouse name: Not on file   Number of children: Not on file   Years of education: Not on file   Highest education level: Not on file  Occupational History   Not on file  Tobacco Use   Smoking status: Former    Current packs/day: 0.00    Average packs/day: 1 pack/day for 40.0 years (40.0 ttl pk-yrs)    Types: Cigarettes    Start date:  03/24/1968    Quit date: 03/24/2008    Years since quitting: 16.2   Smokeless tobacco: Never  Vaping Use   Vaping status: Never Used  Substance and Sexual Activity   Alcohol use: Not Currently    Comment: twice a year   Drug use: No   Sexual activity: Not Currently    Birth control/protection: Post-menopausal, Surgical    Comment: Hysterectomy  Other Topics Concern   Not on file  Social History Narrative   Rashawnda lives with her husband. She takes care of her husband.    Social Drivers of Corporate investment banker Strain: Low Risk  (06/26/2024)   Overall Financial Resource Strain (CARDIA)    Difficulty of Paying Living Expenses: Not hard at all  Food Insecurity: No Food Insecurity (06/26/2024)   Hunger Vital Sign    Worried About Running Out of Food in the Last Year: Never true    Ran Out of Food in the Last Year: Never true  Transportation Needs: No Transportation Needs (06/26/2024)   PRAPARE - Administrator, Civil Service (Medical): No    Lack of Transportation (Non-Medical): No  Physical Activity: Sufficiently Active (06/26/2024)   Exercise Vital Sign    Days of Exercise per Week: 7 days    Minutes of Exercise per Session: 30 min  Stress: Stress Concern Present (06/26/2024)   [employer] of Occupational Health - Occupational Stress Questionnaire    Feeling of Stress: To some extent  Social Connections: Moderately Isolated (06/26/2024)   Social Connection and Isolation Panel    Frequency of Communication with Friends and Family: More than three times a week    Frequency of Social Gatherings with Friends and Family: More than three times a week    Attends Religious Services: Never    Database administrator or Organizations: No    Attends Engineer, structural: Never    Marital Status: Married    Tobacco Counseling Counseling given: Not Answered   Clinical Intake:  Pre-visit preparation completed: Yes  Pain : No/denies pain     BMI -  recorded: 29.13 Nutritional Status: BMI 25 -29 Overweight Nutritional Risks: Other (Comment) Diabetes: Yes CBG done?: No Did pt. bring in CBG monitor from home?: No  How often do you need to have someone help you when you read instructions, pamphlets, or other written materials from your doctor or pharmacy?: 1 - Never What is the last grade level you completed in school?: 13  Interpreter Needed?: No      Activities of Daily Living    06/26/2024    9:05 AM  In your present state of health, do you have any difficulty performing the following activities:  Hearing? 0  Vision? 1  Comment Under care of eye doctor  Difficulty concentrating or making decisions? 0  Walking or climbing stairs?  0  Dressing or bathing? 0  Doing errands, shopping? 0  Preparing Food and eating ? N  Using the Toilet? N  In the past six months, have you accidently leaked urine? Y  Do you have problems with loss of bowel control? N  Managing your Medications? N  Managing your Finances? N  Housekeeping or managing your Housekeeping? N    Patient Care Team: Alvia Bring, DO as PCP - General (Family Medicine) Marleen Redell SAUNDERS, MD as Referring Physician (Specialist)  Indicate any recent Medical Services you may have received from other than [facility] providers in the past year (date may be approximate).     Assessment:   This is a routine wellness examination for Kiaja.  Hearing/Vision screen No results found.   Goals Addressed             This Visit's Progress    Patient Stated       Patient states she would like to maintain a healthy lifestyle.        Depression Screen    06/26/2024    9:17 AM 04/30/2024    9:17 AM 10/31/2023    9:54 AM 10/29/2022    9:17 AM 04/28/2022    9:29 AM 10/29/2021    9:52 AM 10/04/2019    9:24 AM  PHQ 2/9 Scores  PHQ - 2 Score 0 1 2 2 2  0 0  PHQ- 9 Score   6  12      Fall Risk    06/26/2024    9:20 AM 04/30/2024    9:17 AM 10/31/2023    9:54 AM  10/29/2022    9:17 AM 04/28/2022    9:29 AM  Fall Risk   Falls in the past year? 0 0 0 0 0  Number falls in past yr: 0 0 0 1 0  Injury with Fall? 0 0 0 0 0  Risk for fall due to : No Fall Risks No Fall Risks No Fall Risks No Fall Risks No Fall Risks  Follow up Falls evaluation completed Falls evaluation completed Falls evaluation completed Falls evaluation completed  Falls evaluation completed      Data saved with a previous flowsheet row definition    MEDICARE RISK AT HOME: Medicare Risk at Home Any stairs in or around the home?: Yes If so, are there any without handrails?: Yes Home free of loose throw rugs in walkways, pet beds, electrical cords, etc?: Yes Adequate lighting in your home to reduce risk of falls?: Yes Life alert?: No Use of a cane, walker or w/c?: No Grab bars in the bathroom?: No Shower chair or bench in shower?: No Elevated toilet seat or a handicapped toilet?: Yes  TIMED UP AND GO:  Was the test performed?  Yes  Length of time to ambulate 10 feet: 8 sec Gait steady and fast without use of assistive device    Cognitive Function:        06/26/2024    9:21 AM  6CIT Screen  What Year? 0 points  What month? 0 points  What time? 0 points  Count back from 20 0 points  Months in reverse 0 points  Repeat phrase 0 points  Total Score 0 points    Immunizations Immunization History  Administered Date(s) Administered   Fluad Quad(high Dose 65+) 09/29/2021   Fluad Trivalent(High Dose 65+) 10/17/2023   Influenza, High Dose Seasonal PF 09/13/2017, 09/19/2019, 09/22/2020   Influenza,inj,Quad PF,6+ Mos 10/03/2013, 10/03/2014, 10/07/2015, 09/16/2016, 08/09/2018   Influenza,trivalent,  recombinat, inj, PF 09/19/2012   Influenza-Unspecified 09/19/2012   Janssen (J&J) SARS-COV-2 Vaccination 07/12/2020, 11/17/2020   MODERNA COVID-19 SARS-COV-2 PEDS BIVALENT BOOSTER 61yr-37yr 06/23/2021   Pfizer(Comirnaty)Fall Seasonal Vaccine 12 years and older 10/06/2022,  10/31/2023   Pneumococcal Conjugate-13 10/07/2015   Pneumococcal Polysaccharide-23 09/19/2012   Respiratory Syncytial Virus Vaccine,Recomb Aduvanted(Arexvy) 10/06/2022   Tdap 07/16/2016   Zoster Recombinant(Shingrix) 10/06/2022, 10/31/2023   Zoster, Live 03/13/2016    TDAP status: Up to date  Flu Vaccine status: Up to date  Pneumococcal vaccine status: Up to date  Covid-19 vaccine status: Completed vaccines  Qualifies for Shingles Vaccine? Yes   Zostavax completed Yes   Shingrix Completed?: Yes  Screening Tests Health Maintenance  Topic Date Due   COVID-19 Vaccine (6 - 2024-25 season) 04/29/2024   INFLUENZA VACCINE  07/13/2024   OPHTHALMOLOGY EXAM  09/26/2024   HEMOGLOBIN A1C  10/31/2024   Diabetic kidney evaluation - eGFR measurement  04/30/2025   Diabetic kidney evaluation - Urine ACR  04/30/2025   FOOT EXAM  04/30/2025   Medicare Annual Wellness (AWV)  06/26/2025   MAMMOGRAM  03/14/2026   DTaP/Tdap/Td (2 - Td or Tdap) 07/16/2026   Pneumococcal Vaccine: 50+ Years  Completed   DEXA SCAN  Completed   Hepatitis C Screening  Completed   Zoster Vaccines- Shingrix  Completed   Hepatitis B Vaccines  Aged Out   HPV VACCINES  Aged Out   Meningococcal B Vaccine  Aged Out   Lung Cancer Screening  Discontinued   Colonoscopy  Discontinued    Health Maintenance  Health Maintenance Due  Topic Date Due   COVID-19 Vaccine (6 - 2024-25 season) 04/29/2024    Colorectal cancer screening: No longer required.   Mammogram status: Completed 03/14/2024. Repeat every year  Bone Density status: Completed 07/21/2016. Results reflect: Bone density results: NORMAL. Repeat every 2 years.  Lung Cancer Screening: (Low Dose CT Chest recommended if Age 71-80 years, 20 pack-year currently smoking OR have quit w/in 15years.) does not qualify.   Lung Cancer Screening Referral: In program  Additional Screening:  Hepatitis C Screening: does not qualify; Completed 07/16/2016  Vision Screening:  Recommended annual ophthalmology exams for early detection of glaucoma and other disorders of the eye. Is the patient up to date with their annual eye exam?  Yes  Who is the provider or what is the name of the office in which the patient attends annual eye exams? Dr Kosobucki If pt is not established with a provider, would they like to be referred to a provider to establish care? N/a.   Dental Screening: Recommended annual dental exams for proper oral hygiene  Diabetic Foot Exam: Diabetic Foot Exam: Completed 04/30/2024  Community Resource Referral / Chronic Care Management: CRR required this visit?  No   CCM required this visit?  No     Plan:     I have personally reviewed and noted the following in the patient's chart:   Medical and social history Use of alcohol, tobacco or illicit drugs  Current medications and supplements including opioid prescriptions. Patient is not currently taking opioid prescriptions. Functional ability and status Nutritional status Physical activity Advanced directives List of other physicians Hospitalizations, surgeries, and ER visits in previous 12 months. None Vitals Screenings to include cognitive, depression, and falls Referrals and appointments  In addition, I have reviewed and discussed with patient certain preventive protocols, quality metrics, and best practice recommendations. A written personalized care plan for preventive services as well as general preventive health recommendations  were provided to patient.     Bonny Jon Mayor, CMA   06/26/2024   After Visit Summary: (In Person-Printed) AVS printed and given to the patient  Nurse Notes:    Dorothymae Maciver is a 78 y.o. y.o. female patient of Velma Ku, DO who had a Medicare Annual Wellness Visit today via telephone. She reports that he is socially active and does interact with friends/family regularly. She is moderately physically active. She takes care of her husband and 2 grand  kids and 1 great grand kid.

## 2024-06-26 NOTE — Patient Instructions (Signed)
  Ms. Ramaswamy , Thank you for taking time to come for your Medicare Wellness Visit. I appreciate your ongoing commitment to your health goals. Please review the following plan we discussed and let me know if I can assist you in the future.   These are the goals we discussed:  Goals      Patient Stated     Patient states she would like to maintain a healthy lifestyle.         This is a list of the screening recommended for you and due dates:  Health Maintenance  Topic Date Due   COVID-19 Vaccine (6 - 2024-25 season) 04/29/2024   Flu Shot  07/13/2024   Eye exam for diabetics  09/26/2024   Hemoglobin A1C  10/31/2024   Yearly kidney function blood test for diabetes  04/30/2025   Yearly kidney health urinalysis for diabetes  04/30/2025   Complete foot exam   04/30/2025   Medicare Annual Wellness Visit  06/26/2025   Mammogram  03/14/2026   DTaP/Tdap/Td vaccine (2 - Td or Tdap) 07/16/2026   Pneumococcal Vaccine for age over 72  Completed   DEXA scan (bone density measurement)  Completed   Hepatitis C Screening  Completed   Zoster (Shingles) Vaccine  Completed   Hepatitis B Vaccine  Aged Out   HPV Vaccine  Aged Out   Meningitis B Vaccine  Aged Out   Screening for Lung Cancer  Discontinued   Colon Cancer Screening  Discontinued

## 2024-06-30 ENCOUNTER — Other Ambulatory Visit: Payer: Self-pay | Admitting: Family Medicine

## 2024-08-03 DIAGNOSIS — E113513 Type 2 diabetes mellitus with proliferative diabetic retinopathy with macular edema, bilateral: Secondary | ICD-10-CM | POA: Diagnosis not present

## 2024-08-03 DIAGNOSIS — H34212 Partial retinal artery occlusion, left eye: Secondary | ICD-10-CM | POA: Diagnosis not present

## 2024-10-12 DIAGNOSIS — E113513 Type 2 diabetes mellitus with proliferative diabetic retinopathy with macular edema, bilateral: Secondary | ICD-10-CM | POA: Diagnosis not present

## 2024-11-25 ENCOUNTER — Other Ambulatory Visit: Payer: Self-pay | Admitting: Family Medicine

## 2024-11-26 NOTE — Telephone Encounter (Signed)
 Pls contact pt to schedule appt with Dr. Alvia for HTN and HLD

## 2024-11-28 LAB — OPHTHALMOLOGY REPORT-SCANNED

## 2024-12-26 ENCOUNTER — Other Ambulatory Visit: Payer: Self-pay | Admitting: Family Medicine

## 2024-12-26 NOTE — Telephone Encounter (Signed)
 Pls contact pt to schedule 6 month DM follow-up. Past due. Thx

## 2025-01-07 ENCOUNTER — Ambulatory Visit: Admitting: Family Medicine

## 2025-06-27 ENCOUNTER — Ambulatory Visit
